# Patient Record
Sex: Male | Born: 1955 | Race: White | Hispanic: No | Marital: Married | Smoking: Never smoker
Health system: Southern US, Community
[De-identification: ages and names within clinical notes are randomized; demographics above are authoritative.]

## PROBLEM LIST (undated history)

## (undated) DIAGNOSIS — I5189 Other ill-defined heart diseases: Secondary | ICD-10-CM

## (undated) DIAGNOSIS — I2510 Atherosclerotic heart disease of native coronary artery without angina pectoris: Secondary | ICD-10-CM

## (undated) DIAGNOSIS — R918 Other nonspecific abnormal finding of lung field: Secondary | ICD-10-CM

## (undated) DIAGNOSIS — E785 Hyperlipidemia, unspecified: Secondary | ICD-10-CM

## (undated) HISTORY — DX: Hyperlipidemia, unspecified: E78.5

## (undated) HISTORY — DX: Other nonspecific abnormal finding of lung field: R91.8

---

## 2017-06-07 DIAGNOSIS — R918 Other nonspecific abnormal finding of lung field: Secondary | ICD-10-CM

## 2017-06-07 HISTORY — DX: Other nonspecific abnormal finding of lung field: R91.8

## 2017-07-07 ENCOUNTER — Inpatient Hospital Stay (HOSPITAL_COMMUNITY)
Admission: EM | Admit: 2017-07-07 | Discharge: 2017-07-09 | DRG: 247 | Disposition: A | Discharge status: Home / Self Care | Payer: Managed Care, Other (non HMO) | Attending: Cardiology | Admitting: Cardiology

## 2017-07-07 ENCOUNTER — Other Ambulatory Visit: Payer: Self-pay

## 2017-07-07 ENCOUNTER — Encounter (HOSPITAL_COMMUNITY): Payer: Self-pay

## 2017-07-07 ENCOUNTER — Emergency Department (HOSPITAL_COMMUNITY): Payer: Managed Care, Other (non HMO)

## 2017-07-07 DIAGNOSIS — E785 Hyperlipidemia, unspecified: Secondary | ICD-10-CM

## 2017-07-07 DIAGNOSIS — R079 Chest pain, unspecified: Secondary | ICD-10-CM | POA: Diagnosis not present

## 2017-07-07 DIAGNOSIS — Z8249 Family history of ischemic heart disease and other diseases of the circulatory system: Secondary | ICD-10-CM

## 2017-07-07 DIAGNOSIS — R9389 Abnormal findings on diagnostic imaging of other specified body structures: Secondary | ICD-10-CM

## 2017-07-07 DIAGNOSIS — I2510 Atherosclerotic heart disease of native coronary artery without angina pectoris: Secondary | ICD-10-CM | POA: Diagnosis present

## 2017-07-07 DIAGNOSIS — R911 Solitary pulmonary nodule: Secondary | ICD-10-CM | POA: Diagnosis present

## 2017-07-07 DIAGNOSIS — K219 Gastro-esophageal reflux disease without esophagitis: Secondary | ICD-10-CM | POA: Diagnosis present

## 2017-07-07 DIAGNOSIS — I10 Essential (primary) hypertension: Secondary | ICD-10-CM | POA: Diagnosis present

## 2017-07-07 DIAGNOSIS — I2109 ST elevation (STEMI) myocardial infarction involving other coronary artery of anterior wall: Secondary | ICD-10-CM | POA: Diagnosis not present

## 2017-07-07 DIAGNOSIS — Z955 Presence of coronary angioplasty implant and graft: Secondary | ICD-10-CM

## 2017-07-07 DIAGNOSIS — J439 Emphysema, unspecified: Secondary | ICD-10-CM | POA: Diagnosis present

## 2017-07-07 LAB — BASIC METABOLIC PANEL
Anion gap: 11 (ref 5–15)
BUN: 14 mg/dL (ref 6–20)
CALCIUM: 9.1 mg/dL (ref 8.9–10.3)
CO2: 19 mmol/L — ABNORMAL LOW (ref 22–32)
Chloride: 108 mmol/L (ref 101–111)
Creatinine, Ser: 1.05 mg/dL (ref 0.61–1.24)
GFR calc Af Amer: >60 mL/min (ref >60)
GLUCOSE: 100 mg/dL — AB (ref 65–99)
Potassium: 3.6 mmol/L (ref 3.5–5.1)
Sodium: 138 mmol/L (ref 135–145)

## 2017-07-07 LAB — CBC
HEMATOCRIT: 42.5 % (ref 39.0–52.0)
Hemoglobin: 15 g/dL (ref 13.0–17.0)
MCH: 31.3 pg (ref 26.0–34.0)
MCHC: 35.3 g/dL (ref 30.0–36.0)
MCV: 88.7 fL (ref 78.0–100.0)
Platelets: 334 10*3/uL (ref 150–400)
RBC: 4.79 MIL/uL (ref 4.22–5.81)
RDW: 12.3 % (ref 11.5–15.5)
WBC: 6.1 10*3/uL (ref 4.0–10.5)

## 2017-07-07 LAB — I-STAT TROPONIN, ED: TROPONIN I, POC: 0.02 ng/mL (ref 0.00–0.08)

## 2017-07-07 MED ORDER — ONDANSETRON HCL 4 MG PO TABS: 4.0000 mg | ORAL_TABLET | Freq: Four times a day (QID) | ORAL | Status: DC | PRN

## 2017-07-07 MED ORDER — ASPIRIN 81 MG PO CHEW: 324.0000 mg | CHEWABLE_TABLET | Freq: Once | ORAL | Status: DC

## 2017-07-07 MED ORDER — ONDANSETRON HCL 4 MG/2ML IJ SOLN: 4.0000 mg | Freq: Four times a day (QID) | INTRAMUSCULAR | Status: DC | PRN

## 2017-07-07 MED ORDER — ACETAMINOPHEN 325 MG PO TABS: 650.0000 mg | ORAL_TABLET | Freq: Four times a day (QID) | ORAL | Status: DC | PRN

## 2017-07-07 MED ORDER — NITROGLYCERIN 0.4 MG SL SUBL
0.4000 mg | SUBLINGUAL_TABLET | SUBLINGUAL | Status: DC | PRN
  Administered 2017-07-08: 0.8 mg via SUBLINGUAL
  Filled 2017-07-07: qty 1

## 2017-07-07 MED ORDER — ACETAMINOPHEN 650 MG RE SUPP: 650.0000 mg | Freq: Four times a day (QID) | RECTAL | Status: DC | PRN

## 2017-07-07 MED ORDER — ENOXAPARIN SODIUM 40 MG/0.4ML ~~LOC~~ SOLN
40.0000 mg | SUBCUTANEOUS | Status: DC
  Filled 2017-07-07: qty 0.4

## 2017-07-07 NOTE — ED Notes (Addendum)
Admitting MD Kakrakandy at bedside.  

## 2017-07-07 NOTE — ED Notes (Signed)
Pt states that "if I relax I can get the pain to go away for about 15 minutes."

## 2017-07-07 NOTE — Progress Notes (Signed)
Carryover admission: 62 year old male presented with chest pain that is exertional.  Onset a month ago and progressively worsening over the last 2 weeks.  On presentation to the ED, first set of troponin negative EKG with no signs of acute ischemic changes.  Family history of premature coronary artery disease.  Admitted to the telemetry unit as observation status.  No charge.

## 2017-07-07 NOTE — ED Provider Notes (Signed)
[facility] EMERGENCY DEPARTMENT Provider Note   CSN: [id number] Arrival date & time: 07/07/17  1445     History   Chief Complaint Chief Complaint  Patient presents with  . Chest Pain    HPI Elmor Kost is a 62 y.o. male.  HPI    Had gone to [facility] 4/21 for chest pain, that episode had exertional chest pain, woke up with it again the next.  Found spots on lungs, quarantine for 2 days for possible TB which was ruled out but did not have chest pain eval.  Since then has been having chest pain.  The pattern before starting about one month ago was pain with exertion, but now is having pain even at work, and even at rest.  Will come on at night when in bed.  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    Describes pain as burning and tightness followed by fatigue/exhaustion.  Has been there worsening with exertion over the last month but for last week has had it with minimal stress and even rest at times.  Today had associated diaphoresis.   No other medical problems, no smoking, drugs, occ etoh Mom diagnosed with "infantile" sized coronary arteries with CAD in 70s  No recent travel (drove from [place] in Jan) No recent surgeries, no asymmetric leg swelling    History reviewed. No pertinent past medical history.  There are no active problems to display for this patient.   History reviewed. No pertinent surgical history.      Home Medications    Prior to Admission medications   Not on File    Family History No family history on file.  Social History Social History   Tobacco Use  . Smoking status: Not on file  Substance Use Topics  . Alcohol use: Not on file  . Drug use: Not on file     Allergies   Patient has no known allergies.   Review of Systems Review of Systems  Constitutional: Positive for diaphoresis and fatigue. Negative for fever.  HENT:  Negative for sore throat.   Eyes: Negative for visual disturbance.  Respiratory: Positive for shortness of breath.   Cardiovascular: Positive for chest pain.  Gastrointestinal: Negative for abdominal pain, nausea and vomiting.  Genitourinary: Negative for difficulty urinating.  Musculoskeletal: Negative for back pain and neck stiffness.  Skin: Negative for rash.  Neurological: Positive for light-headedness. Negative for syncope and headaches.     Physical Exam Updated Vital Signs BP 133/90   Pulse 75   Resp 15   SpO2 99%   Physical Exam  Constitutional: He is oriented to person, place, and time. He appears well-developed and well-nourished. No distress.  HENT:  Head: Normocephalic and atraumatic.  Eyes: Conjunctivae and EOM are normal.  Neck: Normal range of motion.  Cardiovascular: Normal rate, regular rhythm, normal heart sounds and intact distal pulses. Exam reveals no gallop and no friction rub.  No murmur heard. Pulmonary/Chest: Effort normal and breath sounds normal. No respiratory distress. He has no wheezes. He has no rales.  Abdominal: Soft. He exhibits no distension. There is no tenderness. There is no guarding.  Musculoskeletal: He exhibits no edema.  Neurological: He is alert and oriented to person, place, and time.  Skin: Skin is warm and dry. He is not diaphoretic.  Nursing note and vitals reviewed.    ED Treatments / Results  Labs (all labs ordered are  listed, but only abnormal results are displayed) Labs Reviewed  BASIC METABOLIC PANEL - Abnormal; Notable for the following components:      Result Value   CO2 19 (*)    Glucose, Bld 100 (*)    All other components within normal limits  CBC  I-STAT TROPONIN, ED    EKG EKG Interpretation  Date/Time:  Wednesday Jul 07 2017 14:48:12 EDT Ventricular Rate:  81 PR Interval:    QRS Duration: 89 QT Interval:  376 QTC Calculation: 437 R Axis:   68 Text Interpretation:  Sinus rhythm RSR' in V1 or V2,  probably normal variant Nonspecific T abnrm, anterolateral leads No previous ECGs available Confirmed by Gareth Morgan [phone]) on 07/07/2017 3:05:09 PM   Radiology No results found.  Procedures Procedures (including critical care time)  Medications Ordered in ED Medications - No data to display   Initial Impression / Assessment and Plan / ED Course  I have reviewed the triage vital signs and the nursing notes.  Pertinent labs & imaging results that were available during my care of the patient were reviewed by me and considered in my medical decision making (see chart for details).     62 year old male with no significant medical history presents with concern for chest pain.  Patient reports that chest pain had been only present with exertion beginning a month ago, however that over the last week or 2 he has had increasing chest pain with minimal activity, stressful situations, and sometimes rest.  EKG was evaluate me and showed nonspecific changes.  Chest x-ray shows no signs of pneumonia, pneumothorax, or pulmonary edema.  He does not have risk factors for pulmonary embolus, and have low suspicion for PE.  He was recently admitted to [facility] after he had presented with chest pain, however chest x-ray at that time was concerning for nodules and was admitted for TB work up which was negative.    Given exertional symptoms, worsening symptoms now with rest, concern chest pain represents angina despite patient not having significant risk factors. Does have family hx of CAD.  Heart score on my evaluation is 4.  Will consult for admission. Given aspirin. CP free at this time.    Final Clinical Impressions(s) / ED Diagnoses   Final diagnoses:  Chest pain, unspecified type    ED Discharge Orders    None       Gareth Morgan, MD 07/07/17 [phone]

## 2017-07-07 NOTE — ED Triage Notes (Addendum)
Per [facility]: Was at work has been stressed and left work. Had acute onset of chest pain, across the top of his chest, non radiating. After 15 minutes the pain went away. When he got home was telling his wife about it, started hyperventilating, hands were spasming. When breathing slowed down symptoms went away. Has had cough for a year. Went to ED around Easter and found "spots on his lungs", cleared from cardiac at ED that day. Was trying to follow up with cardiologist.  Took 324 ASA at home.  Pt is pain free at this time.

## 2017-07-07 NOTE — ED Notes (Signed)
Dr. Schlossman at bedside. 

## 2017-07-07 NOTE — H&P (Signed)
History and Physical    Casey Cummings TDV:[id number] DOB: Jul 15, 1955 DOA: 07/07/2017  PCP: Patient, No Pcp Per  Patient coming from: Home.  Chief Complaint: Chest pain.  HPI: Casey Cummings is a 62 y.o. male with history of pulmonary no need recently diagnosed being followed by [facility] who had extensively travel and has been recently working with refugees presently complains of chest pain.  Patient has been having chest pain off and on for last 1 week.  Initially it was exertional substernal across the chest.  May try it became more prominent during nights and while taking shower.  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  Patient also started having chest pain after going home and decided to come to the ER and called EMS.  Chest pain resolved without any intervention.  ED Course: In the ER EKG shows normal sinus rhythm troponin was negative.  Chest x-ray was showing pulmonary nodule.  Presently chest pain-free.  Blood pressure is mildly elevated.  Patient's mother has history of congenital infantile coronaries and had required interventions.  Patient admitted for further management.  Review of Systems: As per HPI, rest all negative.   History reviewed. No pertinent past medical history.  History reviewed. No pertinent surgical history.   reports that he has never smoked. He has never used smokeless tobacco. He reports that he drinks alcohol. He reports that he does not use drugs.  No Known Allergies  Family History  Problem Relation Age of Onset  . CAD Mother     Prior to Admission medications   Medication Sig Start Date End Date Taking? Authorizing Provider  aspirin 325 MG EC tablet Take 325 mg by mouth every 6 (six) hours as needed for pain.   Yes [provider]  aspirin-acetaminophen-caffeine (EXCEDRIN MIGRAINE) [phone] MG tablet Take 1 tablet by mouth every 6 (six) hours as needed for headache.   Yes [provider]  Multiple Vitamin (MULTIVITAMIN) tablet Take 1 tablet by mouth daily.   Yes [provider]    Physical Exam: Vitals:   07/07/17 1945 07/07/17 2000 07/07/17 2015 07/07/17 2030  BP: 128/78 123/83 123/87 123/86  Pulse: 73 70 66 69  Resp: 12 14 18 14   SpO2: 99% 97% 100% 98%      Constitutional: Moderately built and nourished. Vitals:   07/07/17 1945 07/07/17 2000 07/07/17 2015 07/07/17 2030  BP: 128/78 123/83 123/87 123/86  Pulse: 73 70 66 69  Resp: 12 14 18 14   SpO2: 99% 97% 100% 98%   Eyes: Anicteric no pallor. ENMT: No discharge from the ears eyes nose or mouth. Neck: No mass felt.  No neck rigidity. Respiratory: No rhonchi or crepitations. Cardiovascular: S1-S2 heard no murmurs appreciated. Abdomen: Soft nontender bowel sounds present. Musculoskeletal: No edema.  No joint effusion. Skin: No rash.  Skin appears warm. Neurologic: Alert awake oriented to time place and person.  Moves all extremities 5 x 5. Psychiatric: Appears normal.  Normal affect.   Labs on Admission: I have personally reviewed following labs and imaging studies  CBC: Recent Labs  Lab 07/07/17 1454  WBC 6.1  HGB 15.0  HCT 42.5  MCV 88.7  PLT 462   Basic Metabolic Panel: Recent Labs  Lab 07/07/17 1454  NA 138  K 3.6  CL 108  CO2 19*  GLUCOSE 100*  BUN 14  CREATININE 1.05  CALCIUM 9.1   GFR: CrCl cannot be calculated (Unknown ideal weight.).  Liver Function Tests: No results for input(s): AST, ALT, ALKPHOS, BILITOT, PROT, ALBUMIN in the last 168 hours. No results for input(s): LIPASE, AMYLASE in the last 168 hours. No results for input(s): AMMONIA in the last 168 hours. Coagulation Profile: No results for input(s): INR, PROTIME in the last 168 hours. Cardiac Enzymes: No results for input(s): CKTOTAL, CKMB, CKMBINDEX, TROPONINI in the last 168 hours. BNP (last 3 results) No results for input(s): PROBNP in the last 8760 hours. HbA1C: No results for input(s):  HGBA1C in the last 72 hours. CBG: No results for input(s): GLUCAP in the last 168 hours. Lipid Profile: No results for input(s): CHOL, HDL, LDLCALC, TRIG, CHOLHDL, LDLDIRECT in the last 72 hours. Thyroid Function Tests: No results for input(s): TSH, T4TOTAL, FREET4, T3FREE, THYROIDAB in the last 72 hours. Anemia Panel: No results for input(s): VITAMINB12, FOLATE, FERRITIN, TIBC, IRON, RETICCTPCT in the last 72 hours. Urine analysis: No results found for: COLORURINE, APPEARANCEUR, LABSPEC, PHURINE, GLUCOSEU, HGBUR, BILIRUBINUR, KETONESUR, PROTEINUR, UROBILINOGEN, NITRITE, LEUKOCYTESUR Sepsis Labs: @LABRCNTIP (procalcitonin:4,lacticidven:4) )No results found for this or any previous visit (from the past 240 hour(s)).   Radiological Exams on Admission: Dg Chest 2 View  Result Date: 07/07/2017 CLINICAL DATA:  63 year old male with a history of chest pain EXAM: CHEST - 2 VIEW COMPARISON:  06/12/2017 FINDINGS: Cardiomediastinal silhouette unchanged in size and contour. No evidence of central vascular congestion. No pneumothorax or pleural effusion. No confluent airspace disease. Similar appearance of pleuroparenchymal thickening at the bilateral lung apices. Stigmata of emphysema, with increased retrosternal airspace, flattened hemidiaphragms, increased AP diameter, and hyperinflation on the AP view. No displaced fracture IMPRESSION: Chronic lung changes and emphysema, without evidence of acute cardiopulmonary disease. Redemonstration of apical pleuroparenchymal thickening. As was discussed on prior plain film, chest CT is recommended to rule out malignancy. Note that low-dose CT lung cancer screening is recommended for patients who are 84-66 years of age with a 30+ pack-year history of smoking, and who are currently smoking or quit <=15 years ago. Referral for pulmonary consultation and evaluation for potential candidacy may be useful. Electronically Signed   By: Corrie Mckusick D.O.   On: 07/07/2017 15:57      EKG: Independently reviewed.  Normal sinus rhythm.  Assessment/Plan Principal Problem:   Chest pain Active Problems:   Pulmonary nodule    1. Chest pain -we will cycle cardiac markers to rule out ACS.  Check 2D echo.  Check d-dimer.  Aspirin.  PRN nitroglycerin.  Cardiology notified. 2. Elevated blood pressure -closely follow blood pressure trends.  Presently did not add any medications. 3. Pulmonary nodule being followed at [facility].  Was recently admitted for ruling out TB.   DVT prophylaxis: Lovenox. Code Status: Full code. Family Communication: Patient's wife. Disposition Plan: Home. Consults called: Cardiology. Admission status: Observation current   Rise Patience MD [facility] Pager [phone].  If 7PM-7AM, please contact night-coverage [email or web address] Password [facility]  07/07/2017, 10:23 PM

## 2017-07-08 ENCOUNTER — Observation Stay (HOSPITAL_COMMUNITY): Payer: Managed Care, Other (non HMO)

## 2017-07-08 ENCOUNTER — Observation Stay (HOSPITAL_BASED_OUTPATIENT_CLINIC_OR_DEPARTMENT_OTHER): Payer: Managed Care, Other (non HMO)

## 2017-07-08 ENCOUNTER — Other Ambulatory Visit: Payer: Self-pay

## 2017-07-08 ENCOUNTER — Inpatient Hospital Stay (HOSPITAL_COMMUNITY)
Admission: EM | Disposition: A | Discharge status: Home / Self Care | Payer: Self-pay | Source: Home / Self Care | Attending: Cardiology

## 2017-07-08 DIAGNOSIS — R911 Solitary pulmonary nodule: Secondary | ICD-10-CM

## 2017-07-08 DIAGNOSIS — I200 Unstable angina: Secondary | ICD-10-CM

## 2017-07-08 DIAGNOSIS — I2510 Atherosclerotic heart disease of native coronary artery without angina pectoris: Secondary | ICD-10-CM

## 2017-07-08 DIAGNOSIS — R079 Chest pain, unspecified: Secondary | ICD-10-CM

## 2017-07-08 DIAGNOSIS — I2109 ST elevation (STEMI) myocardial infarction involving other coronary artery of anterior wall: Secondary | ICD-10-CM | POA: Diagnosis not present

## 2017-07-08 DIAGNOSIS — I2102 ST elevation (STEMI) myocardial infarction involving left anterior descending coronary artery: Secondary | ICD-10-CM | POA: Diagnosis not present

## 2017-07-08 DIAGNOSIS — R072 Precordial pain: Secondary | ICD-10-CM | POA: Diagnosis not present

## 2017-07-08 DIAGNOSIS — R9389 Abnormal findings on diagnostic imaging of other specified body structures: Secondary | ICD-10-CM

## 2017-07-08 HISTORY — PX: LEFT HEART CATH AND CORONARY ANGIOGRAPHY: CATH118249

## 2017-07-08 HISTORY — PX: CORONARY/GRAFT ACUTE MI REVASCULARIZATION: CATH118305

## 2017-07-08 LAB — LIPID PANEL
CHOLESTEROL: 175 mg/dL (ref 0–200)
HDL: 48 mg/dL (ref >40)
LDL CALC: 102 mg/dL — AB (ref 0–99)
TRIGLYCERIDES: 126 mg/dL (ref <150)
Total CHOL/HDL Ratio: 3.6 RATIO
VLDL: 25 mg/dL (ref 0–40)

## 2017-07-08 LAB — BASIC METABOLIC PANEL
ANION GAP: 6 (ref 5–15)
BUN: 12 mg/dL (ref 6–20)
CALCIUM: 8.8 mg/dL — AB (ref 8.9–10.3)
CO2: 26 mmol/L (ref 22–32)
CREATININE: 1.07 mg/dL (ref 0.61–1.24)
Chloride: 108 mmol/L (ref 101–111)
Glucose, Bld: 94 mg/dL (ref 65–99)
Potassium: 3.9 mmol/L (ref 3.5–5.1)
Sodium: 140 mmol/L (ref 135–145)

## 2017-07-08 LAB — CBC
HCT: 42 % (ref 39.0–52.0)
HEMATOCRIT: 41.7 % (ref 39.0–52.0)
HEMOGLOBIN: 14.1 g/dL (ref 13.0–17.0)
Hemoglobin: 14.3 g/dL (ref 13.0–17.0)
MCH: 31.2 pg (ref 26.0–34.0)
MCH: 30.7 pg (ref 26.0–34.0)
MCHC: 34.3 g/dL (ref 30.0–36.0)
MCHC: 33.6 g/dL (ref 30.0–36.0)
MCV: 90.8 fL (ref 78.0–100.0)
MCV: 91.5 fL (ref 78.0–100.0)
PLATELETS: 292 10*3/uL (ref 150–400)
PLATELETS: 306 10*3/uL (ref 150–400)
RBC: 4.59 MIL/uL (ref 4.22–5.81)
RBC: 4.59 MIL/uL (ref 4.22–5.81)
RDW: 12.8 % (ref 11.5–15.5)
RDW: 13 % (ref 11.5–15.5)
WBC: 7.6 10*3/uL (ref 4.0–10.5)
WBC: 6.5 10*3/uL (ref 4.0–10.5)

## 2017-07-08 LAB — TSH: TSH: 3.323 u[IU]/mL (ref 0.350–4.500)

## 2017-07-08 LAB — CREATININE, SERUM: Creatinine, Ser: 0.87 mg/dL (ref 0.61–1.24)

## 2017-07-08 LAB — ECHOCARDIOGRAM COMPLETE
HEIGHTINCHES: 70 in
Weight: 2569.6 oz

## 2017-07-08 LAB — MRSA PCR SCREENING: MRSA BY PCR: NEGATIVE

## 2017-07-08 LAB — D-DIMER, QUANTITATIVE: D-Dimer, Quant: <0.27 ug/mL-FEU (ref 0.00–0.50)

## 2017-07-08 LAB — HEMOGLOBIN A1C
Hgb A1c MFr Bld: 5.6 % (ref 4.8–5.6)
Mean Plasma Glucose: 114.02 mg/dL

## 2017-07-08 LAB — HIV ANTIBODY (ROUTINE TESTING W REFLEX): HIV SCREEN 4TH GENERATION: NONREACTIVE

## 2017-07-08 LAB — TROPONIN I
TROPONIN I: 0.03 ng/mL — AB (ref <0.03)
TROPONIN I: 0.04 ng/mL — AB (ref <0.03)
Troponin I: 0.03 ng/mL (ref <0.03)
Troponin I: <0.03 ng/mL (ref <0.03)
Troponin I: <0.03 ng/mL (ref <0.03)

## 2017-07-08 LAB — MAGNESIUM: MAGNESIUM: 2 mg/dL (ref 1.7–2.4)

## 2017-07-08 LAB — PROTIME-INR
INR: 1.06
Prothrombin Time: 13.7 seconds (ref 11.4–15.2)

## 2017-07-08 LAB — POCT ACTIVATED CLOTTING TIME: ACTIVATED CLOTTING TIME: 307 s

## 2017-07-08 SURGERY — LEFT HEART CATH AND CORONARY ANGIOGRAPHY
Anesthesia: LOCAL

## 2017-07-08 MED ORDER — NITROGLYCERIN 1 MG/10 ML FOR IR/CATH LAB
INTRA_ARTERIAL | Status: DC | PRN
  Administered 2017-07-08: 200 ug via INTRACORONARY

## 2017-07-08 MED ORDER — MIDAZOLAM HCL 2 MG/2ML IJ SOLN
INTRAMUSCULAR | Status: AC
  Filled 2017-07-08: qty 2

## 2017-07-08 MED ORDER — SODIUM CHLORIDE 0.9 % IV SOLN: INTRAVENOUS | Status: AC

## 2017-07-08 MED ORDER — ASPIRIN 81 MG PO CHEW
CHEWABLE_TABLET | ORAL | Status: AC
  Filled 2017-07-08: qty 4

## 2017-07-08 MED ORDER — ASPIRIN 81 MG PO CHEW
81.0000 mg | CHEWABLE_TABLET | Freq: Every day | ORAL | Status: DC
  Administered 2017-07-09: 81 mg via ORAL
  Filled 2017-07-08: qty 1

## 2017-07-08 MED ORDER — HYDRALAZINE HCL 20 MG/ML IJ SOLN: 5.0000 mg | INTRAMUSCULAR | Status: AC | PRN

## 2017-07-08 MED ORDER — ATORVASTATIN CALCIUM 80 MG PO TABS
80.0000 mg | ORAL_TABLET | Freq: Every day | ORAL | Status: DC
  Administered 2017-07-08: 80 mg via ORAL
  Filled 2017-07-08: qty 1

## 2017-07-08 MED ORDER — HEPARIN BOLUS VIA INFUSION
4000.0000 [IU] | Freq: Once | INTRAVENOUS | Status: DC
Start: 2017-07-08 — End: 2017-07-08
  Filled 2017-07-08: qty 4000

## 2017-07-08 MED ORDER — FENTANYL CITRATE (PF) 100 MCG/2ML IJ SOLN
INTRAMUSCULAR | Status: AC
  Filled 2017-07-08: qty 2

## 2017-07-08 MED ORDER — TICAGRELOR 90 MG PO TABS
ORAL_TABLET | ORAL | Status: DC | PRN
  Administered 2017-07-08: 180 mg via ORAL

## 2017-07-08 MED ORDER — LIDOCAINE HCL (PF) 1 % IJ SOLN
INTRAMUSCULAR | Status: DC | PRN
  Administered 2017-07-08: 2 mL via SUBCUTANEOUS

## 2017-07-08 MED ORDER — SODIUM CHLORIDE 0.9% FLUSH: 3.0000 mL | INTRAVENOUS | Status: DC | PRN

## 2017-07-08 MED ORDER — HEPARIN (PORCINE) IN NACL 2-0.9 UNITS/ML
INTRAMUSCULAR | Status: AC | PRN
  Administered 2017-07-08 (×2): 500 mL

## 2017-07-08 MED ORDER — IOPAMIDOL (ISOVUE-370) INJECTION 76%
100.0000 mL | Freq: Once | INTRAVENOUS | Status: AC | PRN
  Administered 2017-07-08: 80 mL via INTRAVENOUS

## 2017-07-08 MED ORDER — ASPIRIN 81 MG PO CHEW
CHEWABLE_TABLET | ORAL | Status: DC | PRN
  Administered 2017-07-08: 324 mg via ORAL

## 2017-07-08 MED ORDER — SODIUM CHLORIDE 0.9% FLUSH: 3.0000 mL | Freq: Two times a day (BID) | INTRAVENOUS | Status: DC

## 2017-07-08 MED ORDER — HEPARIN SODIUM (PORCINE) 1000 UNIT/ML IJ SOLN
INTRAMUSCULAR | Status: DC | PRN
  Administered 2017-07-08: 3500 [IU] via INTRAVENOUS
  Administered 2017-07-08: 4000 [IU] via INTRAVENOUS

## 2017-07-08 MED ORDER — ASPIRIN 81 MG PO CHEW: 81.0000 mg | CHEWABLE_TABLET | ORAL | Status: DC

## 2017-07-08 MED ORDER — SODIUM CHLORIDE 0.9 % IV SOLN: 250.0000 mL | INTRAVENOUS | Status: DC | PRN

## 2017-07-08 MED ORDER — METOPROLOL TARTRATE 50 MG PO TABS: 50.0000 mg | ORAL_TABLET | Freq: Once | ORAL | Status: DC

## 2017-07-08 MED ORDER — LABETALOL HCL 5 MG/ML IV SOLN: 10.0000 mg | INTRAVENOUS | Status: AC | PRN

## 2017-07-08 MED ORDER — TICAGRELOR 90 MG PO TABS
ORAL_TABLET | ORAL | Status: AC
  Filled 2017-07-08: qty 2

## 2017-07-08 MED ORDER — ATORVASTATIN CALCIUM 40 MG PO TABS: 40.0000 mg | ORAL_TABLET | Freq: Every day | ORAL | Status: DC

## 2017-07-08 MED ORDER — NITROGLYCERIN 0.4 MG SL SUBL
SUBLINGUAL_TABLET | SUBLINGUAL | Status: AC
  Administered 2017-07-08: 0.8 mg via SUBLINGUAL
  Filled 2017-07-08: qty 2

## 2017-07-08 MED ORDER — FENTANYL CITRATE (PF) 100 MCG/2ML IJ SOLN
INTRAMUSCULAR | Status: DC | PRN
  Administered 2017-07-08 (×2): 50 ug via INTRAVENOUS

## 2017-07-08 MED ORDER — SODIUM CHLORIDE 0.9% FLUSH
3.0000 mL | Freq: Two times a day (BID) | INTRAVENOUS | Status: DC
  Administered 2017-07-09: 3 mL via INTRAVENOUS

## 2017-07-08 MED ORDER — HEPARIN (PORCINE) IN NACL 1000-0.9 UT/500ML-% IV SOLN
INTRAVENOUS | Status: AC
  Filled 2017-07-08: qty 1000

## 2017-07-08 MED ORDER — SODIUM CHLORIDE 0.9 % WEIGHT BASED INFUSION: 1.0000 mL/kg/h | INTRAVENOUS | Status: DC

## 2017-07-08 MED ORDER — ONDANSETRON HCL 4 MG/2ML IJ SOLN: 4.0000 mg | Freq: Four times a day (QID) | INTRAMUSCULAR | Status: DC | PRN

## 2017-07-08 MED ORDER — IOPAMIDOL (ISOVUE-370) INJECTION 76%
INTRAVENOUS | Status: AC
  Filled 2017-07-08: qty 100

## 2017-07-08 MED ORDER — SODIUM CHLORIDE 0.9 % WEIGHT BASED INFUSION: 3.0000 mL/kg/h | INTRAVENOUS | Status: DC

## 2017-07-08 MED ORDER — HEPARIN SODIUM (PORCINE) 1000 UNIT/ML IJ SOLN
INTRAMUSCULAR | Status: AC
  Filled 2017-07-08: qty 1

## 2017-07-08 MED ORDER — VERAPAMIL HCL 2.5 MG/ML IV SOLN
INTRAVENOUS | Status: AC
  Filled 2017-07-08: qty 2

## 2017-07-08 MED ORDER — MIDAZOLAM HCL 2 MG/2ML IJ SOLN
INTRAMUSCULAR | Status: DC | PRN
  Administered 2017-07-08: 2 mg via INTRAVENOUS

## 2017-07-08 MED ORDER — VERAPAMIL HCL 2.5 MG/ML IV SOLN
INTRAVENOUS | Status: DC | PRN
  Administered 2017-07-08: 10 mL via INTRA_ARTERIAL

## 2017-07-08 MED ORDER — IOPAMIDOL (ISOVUE-370) INJECTION 76%
INTRAVENOUS | Status: DC | PRN
  Administered 2017-07-08: 115 mL via INTRA_ARTERIAL

## 2017-07-08 MED ORDER — NITROGLYCERIN 1 MG/10 ML FOR IR/CATH LAB
INTRA_ARTERIAL | Status: AC
  Filled 2017-07-08: qty 10

## 2017-07-08 MED ORDER — TICAGRELOR 90 MG PO TABS
90.0000 mg | ORAL_TABLET | Freq: Two times a day (BID) | ORAL | Status: DC
  Administered 2017-07-09: 90 mg via ORAL
  Filled 2017-07-08: qty 1

## 2017-07-08 MED ORDER — IOHEXOL 350 MG/ML SOLN
INTRAVENOUS | Status: DC | PRN
  Administered 2017-07-08: 40 mL via INTRA_ARTERIAL

## 2017-07-08 MED ORDER — IOPAMIDOL (ISOVUE-370) INJECTION 76%
INTRAVENOUS | Status: AC
  Filled 2017-07-08: qty 125

## 2017-07-08 MED ORDER — ACETAMINOPHEN 325 MG PO TABS: 650.0000 mg | ORAL_TABLET | ORAL | Status: DC | PRN

## 2017-07-08 MED ORDER — SODIUM CHLORIDE 0.9 % IV SOLN
INTRAVENOUS | Status: AC | PRN
  Administered 2017-07-08: 250 mL
  Administered 2017-07-08: 20 mL/h via INTRAVENOUS

## 2017-07-08 MED ORDER — HEPARIN (PORCINE) IN NACL 100-0.45 UNIT/ML-% IJ SOLN
850.0000 [IU]/h | INTRAMUSCULAR | Status: DC
  Filled 2017-07-08: qty 250

## 2017-07-08 MED ORDER — LIDOCAINE HCL (PF) 1 % IJ SOLN
INTRAMUSCULAR | Status: AC
  Filled 2017-07-08: qty 30

## 2017-07-08 SURGICAL SUPPLY — 16 items
BALLN SAPPHIRE 2.5X12 (BALLOONS) ×2
BALLN SAPPHIRE ~~LOC~~ 4.0X12 (BALLOONS) ×2 IMPLANT
BALLOON SAPPHIRE 2.5X12 (BALLOONS) ×1 IMPLANT
CATH INFINITI JR4 5F (CATHETERS) ×2 IMPLANT
CATH VISTA GUIDE 6FR XBLAD3.5 (CATHETERS) ×2 IMPLANT
DEVICE RAD COMP TR BAND LRG (VASCULAR PRODUCTS) ×2 IMPLANT
GLIDESHEATH SLEND A-KIT 6F 22G (SHEATH) ×2 IMPLANT
GUIDEWIRE INQWIRE 1.5J.035X260 (WIRE) ×1 IMPLANT
INQWIRE 1.5J .035X260CM (WIRE) ×2
KIT ENCORE 26 ADVANTAGE (KITS) ×2 IMPLANT
KIT HEART LEFT (KITS) ×2 IMPLANT
PACK CARDIAC CATHETERIZATION (CUSTOM PROCEDURE TRAY) ×2 IMPLANT
STENT SYNERGY DES 3.5X16 (Permanent Stent) ×2 IMPLANT
TRANSDUCER W/STOPCOCK (MISCELLANEOUS) ×2 IMPLANT
TUBING CIL FLEX 10 FLL-RA (TUBING) ×2 IMPLANT
WIRE HI TORQ VERSACORE-J 145CM (WIRE) ×2 IMPLANT

## 2017-07-08 NOTE — H&P (View-Only) (Signed)
Cardiology Consultation:   Patient ID: Casey Cummings; [id number]; 12-16-1955   Admit date: 07/07/2017 Date of Consult: 07/08/2017  Primary Care Provider: Patient, No Pcp Per Primary Cardiologist: New to [place]  Primary Electrophysiologist:  None   Patient Profile:   Casey Cummings is a 62 y.o. male with recent hospitalization at [facility] where he presented with complaints of chest pain and was found to have pulmonary nodules concerning for TB (ruled out) and was discharged without a cardiac work-up who is being seen today for the evaluation of chest pain at the request of Dr. Hal Hope.  History of Present Illness:   Casey Cummings was in his usual state of health until early April when he began experiencing substernal chest tightness with intercourse. He noted some DOE at the time, but denied N/V, lightheadedness, dizziness, diaphoresis, or syncope. He stated his symptoms lasted for a few minutes and resolved spontaneously with rest and deep breathing. He continued to have intermittent episodes of chest tightness first thing in the morning when getting in the shower, as well as episodes at work when having stressful conversations. Yesterday he had an episode of chest tightness and DOE prompting him to leave work early, where he states he had to rest walking to the car, and by the time he got home, he was too weak to bring his work bag inside. He attempted deep breathing exercises, however began to feel lightheaded with numbness in his hands and feet, prompting him to call EMS. EMS suggested his lightheadedness and numbness in extremities were a result of hyperventilation.   He denies prior cardiac history or pertinent PMH. He is fairly active and prior to April has never experienced these symptoms before. He was recently admitted to [facility] from 4/21-23/19 with complaints of CP and DOE where he was found to have apical airspace disease with nodules in the RUL c/f possible TB. Incidentally, he was  noted to have a subtle myocardial fat deposition involving the LV apex which may relate to remote ischemia/infarct.  His PPD and AFBs were negative and he was referred to pulmonary outpatient for further work up. His CP was not addressed during this admission.   He is currently CP free. He denies SOB at rest. He has been sleeping on 3 pillows in an attempt to control his GERD which he states is improved. He denies PND, orthopnea, LE edema, syncope, or dizziness. He drinks a few glasses of wine per week. Denies tobacco or illicit drug use. Family history of CAD in his mother who had her first MI in her 27s and CABG in her 68s.  Hospital course:  VSS. Labs notable for electrolytes wnl, Cr 1.07, Hgb 14.1, PLT 306, TSH wnl, Ddimer wnl, Trop 0.03>0.03><0.03. CXR with with chronic lung changes and emphysema without acute findings; also with continue apical pleuroparenchymal thickening previously recommended for f/u CT chest in 4-6 weeks. EKG with NSR, no STE/D. Cardiology consulted for further recommendations.  History reviewed. No pertinent past medical history.  History reviewed. No pertinent surgical history.   Home Medications:  Prior to Admission medications   Medication Sig Start Date End Date Taking? Authorizing Provider  aspirin 325 MG EC tablet Take 325 mg by mouth every 6 (six) hours as needed for pain.   Yes [provider]  aspirin-acetaminophen-caffeine (EXCEDRIN MIGRAINE) [phone] MG tablet Take 1 tablet by mouth every 6 (six) hours as needed for headache.   Yes [provider]  Multiple Vitamin (MULTIVITAMIN) tablet Take 1 tablet by  mouth daily.   Yes [provider]    Inpatient Medications: Scheduled Meds: . enoxaparin (LOVENOX) injection  40 mg Subcutaneous Q24H  . metoprolol tartrate  50 mg Oral Once   Continuous Infusions:  PRN Meds: acetaminophen **OR** acetaminophen, nitroGLYCERIN, ondansetron **OR** ondansetron (ZOFRAN) IV  Allergies:   No  Known Allergies  Social History:   Social History   Socioeconomic History  . Marital status: Married    Spouse name: Not on file  . Number of children: Not on file  . Years of education: Not on file  . Highest education level: Not on file  Occupational History  . Not on file  Social Needs  . Financial resource strain: Not on file  . Food insecurity:    Worry: Not on file    Inability: Not on file  . Transportation needs:    Medical: Not on file    Non-medical: Not on file  Tobacco Use  . Smoking status: Never Smoker  . Smokeless tobacco: Never Used  Substance and Sexual Activity  . Alcohol use: Yes    Comment: Occasionally.  . Drug use: Never  . Sexual activity: Not on file  Lifestyle  . Physical activity:    Days per week: Not on file    Minutes per session: Not on file  . Stress: Not on file  Relationships  . Social connections:    Talks on phone: Not on file    Gets together: Not on file    Attends religious service: Not on file    Active member of club or organization: Not on file    Attends meetings of clubs or organizations: Not on file    Relationship status: Not on file  . Intimate partner violence:    Fear of current or ex partner: Not on file    Emotionally abused: Not on file    Physically abused: Not on file    Forced sexual activity: Not on file  Other Topics Concern  . Not on file  Social History Narrative  . Not on file    Family History:    Family History  Problem Relation Age of Onset  . CAD Mother      ROS:  Please see the history of present illness.   All other ROS reviewed and negative.     Physical Exam/Data:   Vitals:   07/08/17 0830 07/08/17 0845 07/08/17 0943 07/08/17 0955  BP: 116/73 112/71  (!) 123/91  Pulse: 64 (!) 59  72  Resp: 15 (!) 23  20  Temp:    98.4 F (36.9 C)  TempSrc:    Oral  SpO2: 97% 98%  97%  Weight:   160 lb 9.6 oz (72.8 kg)   Height:   5\' 10"  (1.778 m)    No intake or output data in the 24 hours  ending 07/08/17 1231 Filed Weights   07/08/17 0943  Weight: 160 lb 9.6 oz (72.8 kg)   Body mass index is 23.04 kg/m.  General:  Well nourished, well developed, in no acute distress HEENT: sclera anicteric, MMM Lymph: no adenopathy Neck: no JVD Endocrine:  No thryomegaly Vascular: No carotid bruits; distal pulses 2+ bilaterally Cardiac:  normal S1, S2; RRR; no murmur, gallops, or rubs Lungs:  clear to auscultation bilaterally, no wheezing, rhonchi or rales  Abd: NABS, soft, nontender, no hepatomegaly Ext: no edema Musculoskeletal:  No deformities, BUE and BLE strength normal and equal Skin: warm and dry  Neuro:  CNs  2-12 intact, no focal abnormalities noted Psych:  Normal affect   EKG:  The EKG was personally reviewed and demonstrates:  NSR no STE/D Telemetry:  Telemetry was personally reviewed and demonstrates:  NSR  Relevant CV Studies: None  Laboratory Data:  Chemistry Recent Labs  Lab 07/07/17 1454 07/08/17 0218 07/08/17 0500  NA 138  --  140  K 3.6  --  3.9  CL 108  --  108  CO2 19*  --  26  GLUCOSE 100*  --  94  BUN 14  --  12  CREATININE 1.05 0.87 1.07  CALCIUM 9.1  --  8.8*  GFRNONAA >60 >60 >60  GFRAA >60 >60 >60  ANIONGAP 11  --  6    No results for input(s): PROT, ALBUMIN, AST, ALT, ALKPHOS, BILITOT in the last 168 hours. Hematology Recent Labs  Lab 07/07/17 1454 07/08/17 0218 07/08/17 0500  WBC 6.1 7.6 6.5  RBC 4.79 4.59 4.59  HGB 15.0 14.3 14.1  HCT 42.5 41.7 42.0  MCV 88.7 90.8 91.5  MCH 31.3 31.2 30.7  MCHC 35.3 34.3 33.6  RDW 12.3 12.8 13.0  PLT 334 292 306   Cardiac Enzymes Recent Labs  Lab 07/08/17 0218 07/08/17 0500 07/08/17 1042  TROPONINI 0.03* 0.03* <0.03    Recent Labs  Lab 07/07/17 1548  TROPIPOC 0.02    BNPNo results for input(s): BNP, PROBNP in the last 168 hours.  DDimer  Recent Labs  Lab 07/08/17 0218  DDIMER <0.27    Radiology/Studies:  Dg Chest 2 View  Result Date: 07/07/2017 CLINICAL DATA:   62 year old male with a history of chest pain EXAM: CHEST - 2 VIEW COMPARISON:  06/12/2017 FINDINGS: Cardiomediastinal silhouette unchanged in size and contour. No evidence of central vascular congestion. No pneumothorax or pleural effusion. No confluent airspace disease. Similar appearance of pleuroparenchymal thickening at the bilateral lung apices. Stigmata of emphysema, with increased retrosternal airspace, flattened hemidiaphragms, increased AP diameter, and hyperinflation on the AP view. No displaced fracture IMPRESSION: Chronic lung changes and emphysema, without evidence of acute cardiopulmonary disease. Redemonstration of apical pleuroparenchymal thickening. As was discussed on prior plain film, chest CT is recommended to rule out malignancy. Note that low-dose CT lung cancer screening is recommended for patients who are 66-40 years of age with a 30+ pack-year history of smoking, and who are currently smoking or quit <=15 years ago. Referral for pulmonary consultation and evaluation for potential candidacy may be useful. Electronically Signed   By: Corrie Mckusick D.O.   On: 07/07/2017 15:57    Assessment and Plan:   1. Chest pain: p/w intermittent exertional CP and DOE for the past 3 weeks. Troponin trend flat 0.03>0.03. EKG without ischemic changes. His last ischemic testing was a POET which was negative ~5 years ago.  - Will plan for cardiac CTA to further evaluate CP given no significant PMH and only family history of CAD in his mother, onset in her 66s.  - Echo pending to evaluate LV function and for wall motion abnormalities - Can check lipid panel and Hgb A1C for risk stratification   2. Pulmonary nodules: recently hospitalized for TB r/o. Recommended for repeat CT chest in 4-6 weeks. To follow-up outpatient with Pulmonary 07/22/17   For questions or updates, please contact [place] Please consult [email or web address] for contact info under Cardiology/STEMI.   Signed, Abigail Butts,  PA-C  07/08/2017 12:31 PM [phone]   History and all data above reviewed.  Patient examined.  I agree  with the findings as above.  The patient reports chest pain that has been happening with exertion but increasingly severe and with less exertion over time.  It culminated yesterday in pain while at rest while talking to somebody at work.  It was dull substernal.  No radiation or associated symptoms.  He has not had this before.  He does not have significant risk factors but his mother had "very small arteries" and it was suggested that this could be familial.  He had a negative stress test several years ago.  The patient exam reveals COR:RRR  ,  Lungs: clear  ,  Abd: Positive bowel sounds, no rebound no guarding, Ext No edema  .  All available labs, radiology testing, previous records reviewed. Agree with documented assessment and plan. Chest pain:  Moderate pre test probability of CAD.  Plan is for CTA.  Check lipid profile.  Further management based on these results.    Jeneen Rinks Haruna Rohlfs  1:27 PM  07/08/2017

## 2017-07-08 NOTE — Progress Notes (Signed)
Pt c/o chest pain 4/10 during ECHO.  States it lasted 2-3 minutes and went away.  Did not require nitro SL. Dr. Erlinda Hong made aware.  Idolina Primer, RN

## 2017-07-08 NOTE — Progress Notes (Signed)
    Cardiac CTA was concerning for moderate to severe non-calcified plaque in the proximal LAD with associated stenosis that appears >70%. He is recommended for cardiac catheterization for further investigation. Dr. Percival Spanish discussed risks and patient is in agreement to undergo LHC tomorrow. Cath orders are in. NPO after MN tonight. Patient is 2nd case with Dr. Saunders Revel on 07/09/17.

## 2017-07-08 NOTE — Progress Notes (Signed)
  Echocardiogram 2D Echocardiogram has been performed.  Darlina Sicilian M 07/08/2017, 4:46 PM

## 2017-07-08 NOTE — Progress Notes (Signed)
ANTICOAGULATION CONSULT NOTE - Initial Consult  Pharmacy Consult for heparin Indication: chest pain/ACS  No Known Allergies  Patient Measurements: Height: 5\' 10"  (177.8 cm) Weight: 160 lb 9.6 oz (72.8 kg) IBW/kg (Calculated) : 73 Heparin Dosing Weight: 72.8 kg  Vital Signs: Temp: 98.4 F (36.9 C) (05/02 0955) Temp Source: Oral (05/02 0955) BP: 123/91 (05/02 0955) Pulse Rate: 72 (05/02 0955)  Labs: Recent Labs    07/07/17 1454 07/08/17 0218 07/08/17 0500 07/08/17 1042  HGB 15.0 14.3 14.1  --   HCT 42.5 41.7 42.0  --   PLT 334 292 306  --   CREATININE 1.05 0.87 1.07  --   TROPONINI  --  0.03* 0.03* <0.03    Estimated Creatinine Clearance: 74.7 mL/min (by C-G formula based on SCr of 1.07 mg/dL).   Medical History: History reviewed. No pertinent past medical history.  Medications:  Medications Prior to Admission  Medication Sig Dispense Refill Last Dose  . aspirin 325 MG EC tablet Take 325 mg by mouth every 6 (six) hours as needed for pain.   07/07/2017 at Unknown time  . aspirin-acetaminophen-caffeine (EXCEDRIN MIGRAINE) 250-250-65 MG tablet Take 1 tablet by mouth every 6 (six) hours as needed for headache.   prn at prn  . Multiple Vitamin (MULTIVITAMIN) tablet Take 1 tablet by mouth daily.   07/06/2017 at Unknown time    Assessment: Casey Cummings is a 47yoM, Cardiac CTA was concerning for moderate to severe non-calcified plaque in the proximal LAD with associated stenosis that appears >70%. Patient having chest pain, negative troponins. Pharmacy consulted to start heparin. Pt not on anticoagulants prior to this. Lovenox was ordered but refused last night. Plan is for LHC in the morning.  Goal of Therapy:  Heparin level 0.3-0.7 units/ml Monitor platelets by anticoagulation protocol: Yes   Plan: Give 4000 units bolus x 1 Start heparin infusion at 850 units/hr Check anti-Xa level in 6 hours and daily while on heparin Continue to monitor H&H and platelets   Thank  you for allowing [place] to participate in this patients care.  Jens Som, PharmD Clinical phone for 07/08/2017 from 3:30-10:30p: [phone] If after 10:30p, please call main pharmacy at: [phone] 07/08/2017 4:58 PM

## 2017-07-08 NOTE — Progress Notes (Signed)
PROGRESS NOTE  Casey Cummings ERD:[id number] DOB: November 12, 1955 DOA: 07/07/2017 PCP: Patient, No Pcp Per  HPI/Recap of past 24 hours:  Still has intermittent chest pain,   Assessment/Plan: Principal Problem:   Chest pain Active Problems:   Pulmonary nodule  Intermittent chest pain:  -troponin negative, ekg no acute changes,  -cardiology consulted, cardiac cta ordered "There is moderate to severe non-calcified plaque in the proximal LAD with associated stenosis that appears > 70%. A cardiac catheterization is recommended." -he continue to have intermittent chest pain, I have discussed the case with cardiology who recommended start on heparin drip -npo after midnight, cardiac cath in am -cardiology will take over the care.  HTN; stable on betablocker  Pulmonary nodules: recently hospitalized at [facility] , he is ruled out for TB. To follow-up outpatient with Pulmonary 07/22/17,  repeat CT chest in 4-6 weeks.      Code Status: full  Family Communication: patient and wife  Disposition Plan: not ready to discharge   Consultants:  cardiology  Procedures:  none  Antibiotics:  none   Objective: BP (!) 123/91 (BP Location: Right Arm)   Pulse 72   Temp 98.4 F (36.9 C) (Oral)   Resp 20   Ht 5\' 10"  (1.778 m)   Wt 72.8 kg (160 lb 9.6 oz)   SpO2 97%   BMI 23.04 kg/m  No intake or output data in the 24 hours ending 07/08/17 1646 Filed Weights   07/08/17 0943  Weight: 72.8 kg (160 lb 9.6 oz)    Exam: Patient is examined daily including today on 07/08/2017, exams remain the same as of yesterday except that has changed    General:  NAD  Cardiovascular: RRR  Respiratory: CTABL  Abdomen: Soft/ND/NT, positive BS  Musculoskeletal: No Edema  Neuro: alert, oriented   Data Reviewed: Basic Metabolic Panel: Recent Labs  Lab 07/07/17 1454 07/08/17 0218 07/08/17 0500  NA 138  --  140  K 3.6  --  3.9  CL 108  --  108  CO2 19*  --  26  GLUCOSE 100*   --  94  BUN 14  --  12  CREATININE 1.05 0.87 1.07  CALCIUM 9.1  --  8.8*  MG  --  2.0  --    Liver Function Tests: No results for input(s): AST, ALT, ALKPHOS, BILITOT, PROT, ALBUMIN in the last 168 hours. No results for input(s): LIPASE, AMYLASE in the last 168 hours. No results for input(s): AMMONIA in the last 168 hours. CBC: Recent Labs  Lab 07/07/17 1454 07/08/17 0218 07/08/17 0500  WBC 6.1 7.6 6.5  HGB 15.0 14.3 14.1  HCT 42.5 41.7 42.0  MCV 88.7 90.8 91.5  PLT 334 292 306   Cardiac Enzymes:   Recent Labs  Lab 07/08/17 0218 07/08/17 0500 07/08/17 1042  TROPONINI 0.03* 0.03* <0.03   BNP (last 3 results) No results for input(s): BNP in the last 8760 hours.  ProBNP (last 3 results) No results for input(s): PROBNP in the last 8760 hours.  CBG: No results for input(s): GLUCAP in the last 168 hours.  Recent Results (from the past 240 hour(s))  MRSA PCR Screening     Status: None   Collection Time: 07/08/17  1:13 PM  Result Value Ref Range Status   MRSA by PCR NEGATIVE NEGATIVE Final    Comment:        The GeneXpert MRSA Assay (FDA approved for NASAL specimens only), is one component of a comprehensive MRSA colonization  surveillance program. It is not intended to diagnose MRSA infection nor to guide or monitor treatment for MRSA infections. Performed at [facility], [place] [address] [place], [place] [address]      Studies: Ct Coronary Morph W/cta Cor W/score W/ca W/cm &/or Wo/cm  Addendum Date: 07/08/2017   ADDENDUM REPORT: 07/08/2017 14:27 CLINICAL DATA:  62 year old male with exertional chest pain and family h/o premature CAD. EXAM: Cardiac/Coronary  CT TECHNIQUE: The patient was scanned on a [employer]. FINDINGS: A 120 kV prospective scan was triggered in the descending thoracic aorta at 111 HU's. Axial non-contrast 3 mm slices were carried out through the heart. The data set was analyzed on a dedicated work station and scored using the  [place]. Gantry rotation speed was 250 msecs and collimation was .6 mm. No beta blockade and 0.8 mg of sl NTG was given. The 3D data set was reconstructed in 5% intervals of the 67-82 % of the R-R cycle. Diastolic phases were analyzed on a dedicated work station using MPR, MIP and VRT modes. The patient received 80 cc of contrast. Aorta:  Normal size.  No calcifications.  No dissection. Aortic Valve:  Trileaflet.  No calcifications. Coronary Arteries:  Normal coronary origin.  Right dominance. RCA is a large dominant artery that gives rise to PDA and PLVB. There is no plaque. Left main is a large artery that gives rise to LAD, a very small ramus intermedius and LCX arteries. LAD is a medium size artery that has a fairly long severe non-calcified plaque in the proximal portion with associated stenosis that appears > 70%. Mid and distal LAD have small caliber. LCX is a non-dominant artery that gives rise to one large OM1 branch. There is no plaque. Other findings: Normal pulmonary vein drainage into the left atrium. Normal let atrial appendage without a thrombus. Normal size of the pulmonary artery. IMPRESSION: 1. Coronary calcium score of 0. This was 0 percentile for age and sex matched control. 2. Normal coronary origin with right dominance. 3. There is moderate to severe non-calcified plaque in the proximal LAD with associated stenosis that appears > 70%. A cardiac catheterization is recommended. Electronically Signed   By: Ena Dawley   On: 07/08/2017 14:27   Result Date: 07/08/2017 EXAM: OVER-READ INTERPRETATION  CT CHEST The following report is an over-read performed by radiologist Dr. Vinnie Langton of [facility], [place] on 07/08/2017. This over-read does not include interpretation of cardiac or coronary anatomy or pathology. The coronary calcium score/coronary CTA interpretation by the cardiologist is attached. COMPARISON:  None. FINDINGS: Within the visualized portions of the thorax there  are no suspicious appearing pulmonary nodules or masses, there is no acute consolidative airspace disease, no pleural effusions, no pneumothorax and no lymphadenopathy. Visualized portions of the upper abdomen are unremarkable. There are no aggressive appearing lytic or blastic lesions noted in the visualized portions of the skeleton. IMPRESSION: No significant incidental noncardiac findings are noted. Electronically Signed: By: Vinnie Langton M.D. On: 07/08/2017 14:16    Scheduled Meds: . [START ON 07/09/2017] aspirin  81 mg Oral Pre-Cath  . atorvastatin  40 mg Oral q1800  . enoxaparin (LOVENOX) injection  40 mg Subcutaneous Q24H  . iopamidol      . iopamidol      . metoprolol tartrate  50 mg Oral Once  . sodium chloride flush  3 mL Intravenous Q12H    Continuous Infusions: . sodium chloride    . [START ON 07/09/2017] sodium  chloride     Followed by  . [START ON 07/09/2017] sodium chloride       Time spent: 24mins, case discussed with cardiology I have personally reviewed and interpreted on  07/08/2017 daily labs, tele strips, imagings as discussed above under date review session and assessment and plans.  I reviewed all nursing notes, pharmacy notes, consultant notes,  vitals, pertinent old records  I have discussed plan of care as described above with RN , patient and family on 07/08/2017   Florencia Reasons MD, PhD  [facility] Pager [phone]. If 7PM-7AM, please contact night-coverage at [email or web address], password [facility] 07/08/2017, 4:46 PM  LOS: 0 days

## 2017-07-08 NOTE — Progress Notes (Signed)
Pt arrived at the unit at 1850. CHG wipes done. Pt vital signs stable. A/o X4. TR Band has 10 cc of air. Coban added d/t hematoma. Breath sounds clear. HR regular, NSR. No complains of pain. Will continue to monitor.

## 2017-07-08 NOTE — Consult Note (Addendum)
Cardiology Consultation:   Patient ID: Casey Cummings; [id number]; [birth year]   Admit date: 07/07/2017 Date of Consult: 07/08/2017  Primary Care Provider: Patient, No Pcp Per Primary Cardiologist: New to [place]  Primary Electrophysiologist:  None   Patient Profile:   Casey Cummings is a 62 y.o. male with recent hospitalization at [facility] where he presented with complaints of chest pain and was found to have pulmonary nodules concerning for TB (ruled out) and was discharged without a cardiac work-up who is being seen today for the evaluation of chest pain at the request of Dr. Hal Hope.  History of Present Illness:   Mr. Dahm was in his usual state of health until early April when he began experiencing substernal chest tightness with intercourse. He noted some DOE at the time, but denied N/V, lightheadedness, dizziness, diaphoresis, or syncope. He stated his symptoms lasted for a few minutes and resolved spontaneously with rest and deep breathing. He continued to have intermittent episodes of chest tightness first thing in the morning when getting in the shower, as well as episodes at work when having stressful conversations. Yesterday he had an episode of chest tightness and DOE prompting him to leave work early, where he states he had to rest walking to the car, and by the time he got home, he was too weak to bring his work bag inside. He attempted deep breathing exercises, however began to feel lightheaded with numbness in his hands and feet, prompting him to call EMS. EMS suggested his lightheadedness and numbness in extremities were a result of hyperventilation.   He denies prior cardiac history or pertinent PMH. He is fairly active and prior to April has never experienced these symptoms before. He was recently admitted to [facility] from 4/21-23/19 with complaints of CP and DOE where he was found to have apical airspace disease with nodules in the RUL c/f possible TB. Incidentally, he was  noted to have a subtle myocardial fat deposition involving the LV apex which may relate to remote ischemia/infarct.  His PPD and AFBs were negative and he was referred to pulmonary outpatient for further work up. His CP was not addressed during this admission.   He is currently CP free. He denies SOB at rest. He has been sleeping on 3 pillows in an attempt to control his GERD which he states is improved. He denies PND, orthopnea, LE edema, syncope, or dizziness. He drinks a few glasses of wine per week. Denies tobacco or illicit drug use. Family history of CAD in his mother who had her first MI in her 44s and CABG in her 39s.  Hospital course:  VSS. Labs notable for electrolytes wnl, Cr 1.07, Hgb 14.1, PLT 306, TSH wnl, Ddimer wnl, Trop 0.03>0.03><0.03. CXR with with chronic lung changes and emphysema without acute findings; also with continue apical pleuroparenchymal thickening previously recommended for f/u CT chest in 4-6 weeks. EKG with NSR, no STE/D. Cardiology consulted for further recommendations.  History reviewed. No pertinent past medical history.  History reviewed. No pertinent surgical history.   Home Medications:  Prior to Admission medications   Medication Sig Start Date End Date Taking? Authorizing Provider  aspirin 325 MG EC tablet Take 325 mg by mouth every 6 (six) hours as needed for pain.   Yes [provider]  aspirin-acetaminophen-caffeine (EXCEDRIN MIGRAINE) [phone] MG tablet Take 1 tablet by mouth every 6 (six) hours as needed for headache.   Yes [provider]  Multiple Vitamin (MULTIVITAMIN) tablet Take 1 tablet by  mouth daily.   Yes [provider]    Inpatient Medications: Scheduled Meds: . enoxaparin (LOVENOX) injection  40 mg Subcutaneous Q24H  . metoprolol tartrate  50 mg Oral Once   Continuous Infusions:  PRN Meds: acetaminophen **OR** acetaminophen, nitroGLYCERIN, ondansetron **OR** ondansetron (ZOFRAN) IV  Allergies:   No  Known Allergies  Social History:   Social History   Socioeconomic History  . Marital status: Married    Spouse name: Not on file  . Number of children: Not on file  . Years of education: Not on file  . Highest education level: Not on file  Occupational History  . Not on file  Social Needs  . Financial resource strain: Not on file  . Food insecurity:    Worry: Not on file    Inability: Not on file  . Transportation needs:    Medical: Not on file    Non-medical: Not on file  Tobacco Use  . Smoking status: Never Smoker  . Smokeless tobacco: Never Used  Substance and Sexual Activity  . Alcohol use: Yes    Comment: Occasionally.  . Drug use: Never  . Sexual activity: Not on file  Lifestyle  . Physical activity:    Days per week: Not on file    Minutes per session: Not on file  . Stress: Not on file  Relationships  . Social connections:    Talks on phone: Not on file    Gets together: Not on file    Attends religious service: Not on file    Active member of club or organization: Not on file    Attends meetings of clubs or organizations: Not on file    Relationship status: Not on file  . Intimate partner violence:    Fear of current or ex partner: Not on file    Emotionally abused: Not on file    Physically abused: Not on file    Forced sexual activity: Not on file  Other Topics Concern  . Not on file  Social History Narrative  . Not on file    Family History:    Family History  Problem Relation Age of Onset  . CAD Mother      ROS:  Please see the history of present illness.   All other ROS reviewed and negative.     Physical Exam/Data:   Vitals:   07/08/17 0830 07/08/17 0845 07/08/17 0943 07/08/17 0955  BP: 116/73 112/71  (!) 123/91  Pulse: 64 (!) 59  72  Resp: 15 (!) 23  20  Temp:    98.4 F (36.9 C)  TempSrc:    Oral  SpO2: 97% 98%  97%  Weight:   160 lb 9.6 oz (72.8 kg)   Height:   5\' 10"  (1.778 m)    No intake or output data in the 24 hours  ending 07/08/17 1231 Filed Weights   07/08/17 0943  Weight: 160 lb 9.6 oz (72.8 kg)   Body mass index is 23.04 kg/m.  General:  Well nourished, well developed, in no acute distress HEENT: sclera anicteric, MMM Lymph: no adenopathy Neck: no JVD Endocrine:  No thryomegaly Vascular: No carotid bruits; distal pulses 2+ bilaterally Cardiac:  normal S1, S2; RRR; no murmur, gallops, or rubs Lungs:  clear to auscultation bilaterally, no wheezing, rhonchi or rales  Abd: NABS, soft, nontender, no hepatomegaly Ext: no edema Musculoskeletal:  No deformities, BUE and BLE strength normal and equal Skin: warm and dry  Neuro:  CNs  2-12 intact, no focal abnormalities noted Psych:  Normal affect   EKG:  The EKG was personally reviewed and demonstrates:  NSR no STE/D Telemetry:  Telemetry was personally reviewed and demonstrates:  NSR  Relevant CV Studies: None  Laboratory Data:  Chemistry Recent Labs  Lab 07/07/17 1454 07/08/17 0218 07/08/17 0500  NA 138  --  140  K 3.6  --  3.9  CL 108  --  108  CO2 19*  --  26  GLUCOSE 100*  --  94  BUN 14  --  12  CREATININE 1.05 0.87 1.07  CALCIUM 9.1  --  8.8*  GFRNONAA >60 >60 >60  GFRAA >60 >60 >60  ANIONGAP 11  --  6    No results for input(s): PROT, ALBUMIN, AST, ALT, ALKPHOS, BILITOT in the last 168 hours. Hematology Recent Labs  Lab 07/07/17 1454 07/08/17 0218 07/08/17 0500  WBC 6.1 7.6 6.5  RBC 4.79 4.59 4.59  HGB 15.0 14.3 14.1  HCT 42.5 41.7 42.0  MCV 88.7 90.8 91.5  MCH 31.3 31.2 30.7  MCHC 35.3 34.3 33.6  RDW 12.3 12.8 13.0  PLT 334 292 306   Cardiac Enzymes Recent Labs  Lab 07/08/17 0218 07/08/17 0500 07/08/17 1042  TROPONINI 0.03* 0.03* <0.03    Recent Labs  Lab 07/07/17 1548  TROPIPOC 0.02    BNPNo results for input(s): BNP, PROBNP in the last 168 hours.  DDimer  Recent Labs  Lab 07/08/17 0218  DDIMER <0.27    Radiology/Studies:  Dg Chest 2 View  Result Date: 07/07/2017 CLINICAL DATA:   62 year old male with a history of chest pain EXAM: CHEST - 2 VIEW COMPARISON:  06/12/2017 FINDINGS: Cardiomediastinal silhouette unchanged in size and contour. No evidence of central vascular congestion. No pneumothorax or pleural effusion. No confluent airspace disease. Similar appearance of pleuroparenchymal thickening at the bilateral lung apices. Stigmata of emphysema, with increased retrosternal airspace, flattened hemidiaphragms, increased AP diameter, and hyperinflation on the AP view. No displaced fracture IMPRESSION: Chronic lung changes and emphysema, without evidence of acute cardiopulmonary disease. Redemonstration of apical pleuroparenchymal thickening. As was discussed on prior plain film, chest CT is recommended to rule out malignancy. Note that low-dose CT lung cancer screening is recommended for patients who are 86-73 years of age with a 30+ pack-year history of smoking, and who are currently smoking or quit <=15 years ago. Referral for pulmonary consultation and evaluation for potential candidacy may be useful. Electronically Signed   By: Corrie Mckusick D.O.   On: 07/07/2017 15:57    Assessment and Plan:   1. Chest pain: p/w intermittent exertional CP and DOE for the past 3 weeks. Troponin trend flat 0.03>0.03. EKG without ischemic changes. His last ischemic testing was a POET which was negative ~5 years ago.  - Will plan for cardiac CTA to further evaluate CP given no significant PMH and only family history of CAD in his mother, onset in her 84s.  - Echo pending to evaluate LV function and for wall motion abnormalities - Can check lipid panel and Hgb A1C for risk stratification   2. Pulmonary nodules: recently hospitalized for TB r/o. Recommended for repeat CT chest in 4-6 weeks. To follow-up outpatient with Pulmonary 07/22/17   For questions or updates, please contact [place] Please consult [email or web address] for contact info under Cardiology/STEMI.   Signed, Abigail Butts,  PA-C  07/08/2017 12:31 PM [phone]   History and all data above reviewed.  Patient examined.  I agree  with the findings as above.  The patient reports chest pain that has been happening with exertion but increasingly severe and with less exertion over time.  It culminated yesterday in pain while at rest while talking to somebody at work.  It was dull substernal.  No radiation or associated symptoms.  He has not had this before.  He does not have significant risk factors but his mother had "very small arteries" and it was suggested that this could be familial.  He had a negative stress test several years ago.  The patient exam reveals COR:RRR  ,  Lungs: clear  ,  Abd: Positive bowel sounds, no rebound no guarding, Ext No edema  .  All available labs, radiology testing, previous records reviewed. Agree with documented assessment and plan. Chest pain:  Moderate pre test probability of CAD.  Plan is for CTA.  Check lipid profile.  Further management based on these results.    Jeneen Rinks Lativia Velie  1:27 PM  07/08/2017

## 2017-07-08 NOTE — Progress Notes (Addendum)
   Notified by hospitalist service that patient had an episode of CP around 4:35pm while having an echocardiogram. His CP resolved spontaneously after 2-3 minutes. EKG however showed STE in V2-3. Also with Cardiac CTA today with concern for LAD stenosis. STEMI code activated. VSS. Patient will go to the cath lab this evening with Dr. Ellyn Hack.   Patient has been transferred to the cardiology service.  EKG reviewed.  The does appear to be somewhat biphasic ST and T wave changes in the anterior septal leads.  Given the CTA findings, would suspect that he proba  Upon arrival to the Cath Lab, he is chest pain-free.  Glenetta Hew, MD

## 2017-07-08 NOTE — Plan of Care (Signed)
  Problem: Education: Goal: Knowledge of General Education information will improve Outcome: Progressing   Problem: Health Behavior/Discharge Planning: Goal: Ability to manage health-related needs will improve Outcome: Progressing   Problem: Clinical Measurements: Goal: Ability to maintain clinical measurements within normal limits will improve Outcome: Progressing Goal: Will remain free from infection Outcome: Progressing Goal: Diagnostic test results will improve Outcome: Progressing Goal: Respiratory complications will improve Outcome: Progressing Goal: Cardiovascular complication will be avoided Outcome: Progressing   Problem: Activity: Goal: Risk for activity intolerance will decrease Outcome: Progressing   Problem: Nutrition: Goal: Adequate nutrition will be maintained Outcome: Progressing   Problem: Coping: Goal: Level of anxiety will decrease Outcome: Progressing   Problem: Elimination: Goal: Will not experience complications related to bowel motility Outcome: Progressing Goal: Will not experience complications related to urinary retention Outcome: Progressing   Problem: Pain Managment: Goal: General experience of comfort will improve Outcome: Progressing   Problem: Safety: Goal: Ability to remain free from injury will improve Outcome: Progressing   Problem: Skin Integrity: Goal: Risk for impaired skin integrity will decrease Outcome: Progressing   Problem: Education: Goal: Understanding of cardiac disease, CV risk reduction, and recovery process will improve Outcome: Progressing Goal: Understanding of medication regimen will improve Outcome: Progressing   Problem: Activity: Goal: Ability to tolerate increased activity will improve Outcome: Progressing   Problem: Cardiac: Goal: Ability to achieve and maintain adequate cardiopulmonary perfusion will improve Outcome: Progressing Goal: Vascular access site(s) Level 0-1 will be maintained Outcome:  Progressing   Problem: Health Behavior/Discharge Planning: Goal: Ability to safely manage health-related needs after discharge will improve Outcome: Progressing   

## 2017-07-08 NOTE — Interval H&P Note (Signed)
History and Physical Interval Note:  07/08/2017 5:42 PM  Casey Cummings  has presented today for surgery, with the diagnosis of CP-? Anterior STEMI  Cardiac CTA was concerning for moderate to severe non-calcified plaque in the proximal LAD with associated stenosis that appears >70%. He is recommended for cardiac catheterization for further investigation. Dr. Percival Spanish discussed risks and patient is in agreement to undergo LHC tomorrow. Cath orders are in. NPO after MN tonight. Patient is 2nd case with Dr. Saunders Revel on 07/09/17.   @ 1730 -- Cardiology team called: Notified by hospitalist service that patient had an episode of CP around 4:35pm while having an echocardiogram. His CP resolved spontaneously after 2-3 minutes. EKG however showed STE in V2-3. Also with Cardiac CTA today with concern for LAD stenosis. STEMI code activated. VSS. Patient will go to the cath lab this evening with Dr. Ellyn Hack.   Patient has been transferred to the cardiology service.             The various methods of treatment have been discussed with the patient and family. After consideration of risks, benefits and other options for treatment, the patient has consented to  Procedure(s): LEFT HEART CATH AND CORONARY ANGIOGRAPHY (N/A)  With possible PERCUTANEOUS CORONARY INTERVENTION as a surgical intervention .  The patient's history has been reviewed, patient examined, no change in status, stable for surgery.  I have reviewed the patient's chart and labs.  Questions were answered to the patient's satisfaction.    Cath Lab Visit (complete for each Cath Lab visit)  Clinical Evaluation Leading to the Procedure:   ACS: Yes.    Non-ACS:    Anginal Classification: CCS IV  Anti-ischemic medical therapy: No Therapy  Non-Invasive Test Results: High-risk stress test findings: cardiac mortality >3%/year  Prior CABG: No previous CABG   Glenetta Hew

## 2017-07-09 ENCOUNTER — Encounter (HOSPITAL_COMMUNITY): Payer: Self-pay | Admitting: Cardiology

## 2017-07-09 ENCOUNTER — Telehealth: Payer: Self-pay

## 2017-07-09 DIAGNOSIS — R911 Solitary pulmonary nodule: Secondary | ICD-10-CM | POA: Diagnosis present

## 2017-07-09 DIAGNOSIS — I2109 ST elevation (STEMI) myocardial infarction involving other coronary artery of anterior wall: Secondary | ICD-10-CM | POA: Diagnosis present

## 2017-07-09 DIAGNOSIS — R079 Chest pain, unspecified: Secondary | ICD-10-CM | POA: Diagnosis present

## 2017-07-09 DIAGNOSIS — Z8249 Family history of ischemic heart disease and other diseases of the circulatory system: Secondary | ICD-10-CM | POA: Diagnosis not present

## 2017-07-09 DIAGNOSIS — I10 Essential (primary) hypertension: Secondary | ICD-10-CM | POA: Diagnosis present

## 2017-07-09 DIAGNOSIS — E785 Hyperlipidemia, unspecified: Secondary | ICD-10-CM | POA: Diagnosis present

## 2017-07-09 DIAGNOSIS — J439 Emphysema, unspecified: Secondary | ICD-10-CM | POA: Diagnosis present

## 2017-07-09 DIAGNOSIS — K219 Gastro-esophageal reflux disease without esophagitis: Secondary | ICD-10-CM | POA: Diagnosis present

## 2017-07-09 DIAGNOSIS — I2510 Atherosclerotic heart disease of native coronary artery without angina pectoris: Secondary | ICD-10-CM | POA: Diagnosis present

## 2017-07-09 LAB — BASIC METABOLIC PANEL
ANION GAP: 7 (ref 5–15)
BUN: 14 mg/dL (ref 6–20)
CHLORIDE: 110 mmol/L (ref 101–111)
CO2: 21 mmol/L — ABNORMAL LOW (ref 22–32)
Calcium: 8.4 mg/dL — ABNORMAL LOW (ref 8.9–10.3)
Creatinine, Ser: 0.95 mg/dL (ref 0.61–1.24)
Glucose, Bld: 83 mg/dL (ref 65–99)
Potassium: 3.9 mmol/L (ref 3.5–5.1)
SODIUM: 138 mmol/L (ref 135–145)

## 2017-07-09 LAB — CBC
HEMATOCRIT: 41.9 % (ref 39.0–52.0)
Hemoglobin: 14.1 g/dL (ref 13.0–17.0)
MCH: 30.9 pg (ref 26.0–34.0)
MCHC: 33.7 g/dL (ref 30.0–36.0)
MCV: 91.9 fL (ref 78.0–100.0)
Platelets: 289 10*3/uL (ref 150–400)
RBC: 4.56 MIL/uL (ref 4.22–5.81)
RDW: 13 % (ref 11.5–15.5)
WBC: 8 10*3/uL (ref 4.0–10.5)

## 2017-07-09 LAB — TROPONIN I: Troponin I: 0.14 ng/mL (ref <0.03)

## 2017-07-09 MED ORDER — NITROGLYCERIN 0.4 MG SL SUBL: 0.4000 mg | SUBLINGUAL_TABLET | SUBLINGUAL | 3 refills | Status: DC | PRN

## 2017-07-09 MED ORDER — ASPIRIN 81 MG PO CHEW: 81.0000 mg | CHEWABLE_TABLET | Freq: Every day | ORAL | 3 refills | Status: AC

## 2017-07-09 MED ORDER — TICAGRELOR 90 MG PO TABS: 90.0000 mg | ORAL_TABLET | Freq: Two times a day (BID) | ORAL | 3 refills | Status: DC

## 2017-07-09 MED ORDER — TICAGRELOR 90 MG PO TABS: 90.0000 mg | ORAL_TABLET | Freq: Two times a day (BID) | ORAL | 0 refills | Status: DC

## 2017-07-09 MED ORDER — ATORVASTATIN CALCIUM 80 MG PO TABS: 80.0000 mg | ORAL_TABLET | Freq: Every day | ORAL | 3 refills | Status: DC

## 2017-07-09 NOTE — Telephone Encounter (Signed)
Pt is still in the hospital-call Monday 07-12-17

## 2017-07-09 NOTE — Discharge Instructions (Signed)
PLEASE REMEMBER TO BRING ALL OF YOUR MEDICATIONS TO EACH OF YOUR FOLLOW-UP OFFICE VISITS.  PLEASE ATTEND ALL SCHEDULED FOLLOW-UP APPOINTMENTS.   Activity: Increase activity slowly as tolerated. You may shower, but no soaking baths (or swimming) for 1 week. No driving for 24 hours. No lifting over 5 lbs for 1 week. No sexual activity for 1 week.   You May Return to Work: in 1 week (if applicable)  Wound Care: You may wash cath site gently with soap and water. Keep cath site clean and dry. If you notice pain, swelling, bleeding or pus at your cath site, please call [phone].   It is very important that you DO NOT miss any doses of your Brilinta. Missing doses puts you at risk of developing blockages in your heart stent.

## 2017-07-09 NOTE — Discharge Summary (Addendum)
Discharge Summary    Patient ID: Casey Cummings,  MRN: [id number], DOB/AGE: [birth year] 62 y.o.  Admit date: 07/07/2017 Discharge date: 07/09/2017  Primary Care Provider: Patient, No Pcp Per Primary Cardiologist: Minus Breeding, MD  Discharge Diagnoses    Principal Problem:   Acute ST elevation myocardial infarction (STEMI) of anterior wall [facility]) Active Problems:   Chest pain   Pulmonary nodule   Abnormal findings on diagnostic imaging of cardiovascular system   Acute anterior wall MI ([place])   Dyslipidemia   Allergies No Known Allergies  Diagnostic Studies/Procedures    Left Heart Catheterization 07/08/17: Conclusion     Culprit lesion: Prox LAD lesion is 90% stenosed.  A drug-eluting stent was successfully placed using a STENT SYNERGY DES 3.5X16. Postdilated in tapered fashion to 4.1-3.9 mm  Post intervention, there is a 0% residual stenosis.  Mid LAD to Dist LAD lesion is 40% stenosed. The downstream LAD is small caliber diffusely diseased.  The left ventricular systolic function is normal. The left ventricular ejection fraction is 55-65% by visual estimate.  LV end diastolic pressure is normal.   Severe single-vessel disease status post PCI with DES stent.  He will be transferred to the ICU overnight based on the acute nature of his presentation.  Plan: DAPT x 1 yr Potential discharge tomorrow if stable Aggressive risk factor medication with statin, beta-blocker and possibly ARB.   Echocardiogram 07/08/17: Study Conclusions  - Left ventricle: The cavity size was normal. Wall thickness was   increased in a pattern of mild LVH. Systolic function was   vigorous. The estimated ejection fraction was in the range of 65%   to 70%. Wall motion was normal; there were no regional wall   motion abnormalities. Doppler parameters are consistent with   abnormal left ventricular relaxation (grade 1 diastolic   dysfunction). The E/e&' ratio is between 8-15,  suggesting   indeterminate LV filling pressure. - Left atrium: The atrium was normal in size. - Inferior vena cava: The vessel was normal in size. The   respirophasic diameter changes were in the normal range (>= 50%),   consistent with normal central venous pressure.  Impressions:  - LVEF 65-70%, mild LVH, normal wall motion, grade 1 DD,   indeterminate LV filling pressure, normal LA size, normal IVC.  Cardiac CTA 07/08/17: IMPRESSION: 1. Coronary calcium score of 0. This was 0 percentile for age and sex matched control.  2. Normal coronary origin with right dominance.  3. There is moderate to severe non-calcified plaque in the proximal LAD with associated stenosis that appears > 70%. A cardiac catheterization is recommended.  _____________   History of Present Illness     Casey Cummings was in his usual state of health until early April when he began experiencing substernal chest tightness with intercourse. He noted some DOE at the time, but denied N/V, lightheadedness, dizziness, diaphoresis, or syncope. He stated his symptoms lasted for a few minutes and resolved spontaneously with rest and deep breathing. He continued to have intermittent episodes of chest tightness first thing in the morning when getting in the shower, as well as episodes at work when having stressful conversations. Yesterday he had an episode of chest tightness and DOE prompting him to leave work early, where he states he had to rest walking to the car, and by the time he got home, he was too weak to bring his work bag inside. He attempted deep breathing exercises, however began to feel lightheaded with numbness in  his hands and feet, prompting him to call EMS. EMS suggested his lightheadedness and numbness in extremities were a result of hyperventilation.   He denies prior cardiac history or pertinent PMH. He is fairly active and prior to April has never experienced these symptoms before. He was recently  admitted to [facility] from 4/21-23/19 with complaints of CP and DOE where he was found to have apical airspace disease with nodules in the RUL c/f possible TB. Incidentally, he was noted to have a subtle myocardial fat deposition involving the LV apex which may relate to remote ischemia/infarct.  His PPD and AFBs were negative and he was referred to pulmonary outpatient for further work up. His CP was not addressed during this admission.   At the time of consultation he was CP free. He denies SOB at rest. He has been sleeping on 3 pillows in an attempt to control his GERD which he states is improved. He denies PND, orthopnea, LE edema, syncope, or dizziness. He drinks a few glasses of wine per week. Denies tobacco or illicit drug use. Family history of CAD in his mother who had her first MI in her 31s and CABG in her 75s.  Hospital course at the time of consultation:  VSS. Labs notable for electrolytes wnl, Cr 1.07, Hgb 14.1, PLT 306, TSH wnl, Ddimer wnl, Trop 0.03>0.03><0.03. CXR with with chronic lung changes and emphysema without acute findings; also with continue apical pleuroparenchymal thickening previously recommended for f/u CT chest in 4-6 weeks. EKG with NSR, no STE/D.    Hospital Course     Consultants: None   Casey Cummings presented with complaints of exertional chest pain over the past several weeks. He had no prior cardiac history. Echo with EF 65-70%, G1DD, no wall motion abnormalities, and no significant valvular abnormalities. He underwent Cardiac CTA given low risk factors and was found to have high-grade stenosis of his LAD. He subsequently had another episode of CP while admitted. EKG with acute anterior ST elevation and code STEMI was activated. Patient underwent LHC 07/08/17 with 90% stenosis of LAD with successful PCI/DES to LAD. He was recommended for DAPT x1 year. Troponin peaked at 0.14. EKG following LHC with resolution of STE. Labs stable on the day of discharge.  1. Anterior  STEMI: s/p PCI/DES to LAD 07/08/17 - Started on ASA, brilinta, and statin  2. Dyslipidemia: LDL 102; goal <70 - Started on atorvastatin 80mg  - recheck LFTs in 6-8 weeks  _____________  Discharge Vitals Blood pressure 134/85, pulse 68, temperature 98.5 F (36.9 C), temperature source Oral, resp. rate 19, height 5\' 10"  (1.778 m), weight 160 lb 9.6 oz (72.8 kg), SpO2 99 %.  Filed Weights   07/08/17 0943  Weight: 160 lb 9.6 oz (72.8 kg)    Labs & Radiologic Studies    CBC Recent Labs    07/08/17 0500 07/09/17 0502  WBC 6.5 8.0  HGB 14.1 14.1  HCT 42.0 41.9  MCV 91.5 91.9  PLT 306 671   Basic Metabolic Panel Recent Labs    07/08/17 0218 07/08/17 0500 07/09/17 0502  NA  --  140 138  K  --  3.9 3.9  CL  --  108 110  CO2  --  26 21*  GLUCOSE  --  94 83  BUN  --  12 14  CREATININE 0.87 1.07 0.95  CALCIUM  --  8.8* 8.4*  MG 2.0  --   --    Liver Function Tests No results for input(s):  AST, ALT, ALKPHOS, BILITOT, PROT, ALBUMIN in the last 72 hours. No results for input(s): LIPASE, AMYLASE in the last 72 hours. Cardiac Enzymes Recent Labs    07/08/17 1704 07/08/17 2237 07/09/17 0502  TROPONINI <0.03 0.04* 0.14*   BNP Invalid input(s): POCBNP D-Dimer Recent Labs    07/08/17 0218  DDIMER <0.27   Hemoglobin A1C Recent Labs    07/08/17 0500  HGBA1C 5.6   Fasting Lipid Panel Recent Labs    07/08/17 1244  CHOL 175  HDL 48  LDLCALC 102*  TRIG 126  CHOLHDL 3.6   Thyroid Function Tests Recent Labs    07/08/17 0219  TSH 3.323   _____________  Dg Chest 2 View  Result Date: 07/07/2017 CLINICAL DATA:  62 year old male with a history of chest pain EXAM: CHEST - 2 VIEW COMPARISON:  06/12/2017 FINDINGS: Cardiomediastinal silhouette unchanged in size and contour. No evidence of central vascular congestion. No pneumothorax or pleural effusion. No confluent airspace disease. Similar appearance of pleuroparenchymal thickening at the bilateral lung apices.  Stigmata of emphysema, with increased retrosternal airspace, flattened hemidiaphragms, increased AP diameter, and hyperinflation on the AP view. No displaced fracture IMPRESSION: Chronic lung changes and emphysema, without evidence of acute cardiopulmonary disease. Redemonstration of apical pleuroparenchymal thickening. As was discussed on prior plain film, chest CT is recommended to rule out malignancy. Note that low-dose CT lung cancer screening is recommended for patients who are 68-48 years of age with a 30+ pack-year history of smoking, and who are currently smoking or quit <=15 years ago. Referral for pulmonary consultation and evaluation for potential candidacy may be useful. Electronically Signed   By: Corrie Mckusick D.O.   On: 07/07/2017 15:57   Ct Coronary Morph W/cta Cor W/score W/ca W/cm &/or Wo/cm  Addendum Date: 07/08/2017   ADDENDUM REPORT: 07/08/2017 14:27 CLINICAL DATA:  62 year old male with exertional chest pain and family h/o premature CAD. EXAM: Cardiac/Coronary  CT TECHNIQUE: The patient was scanned on a [employer]. FINDINGS: A 120 kV prospective scan was triggered in the descending thoracic aorta at 111 HU's. Axial non-contrast 3 mm slices were carried out through the heart. The data set was analyzed on a dedicated work station and scored using the [place]. Gantry rotation speed was 250 msecs and collimation was .6 mm. No beta blockade and 0.8 mg of sl NTG was given. The 3D data set was reconstructed in 5% intervals of the 67-82 % of the R-R cycle. Diastolic phases were analyzed on a dedicated work station using MPR, MIP and VRT modes. The patient received 80 cc of contrast. Aorta:  Normal size.  No calcifications.  No dissection. Aortic Valve:  Trileaflet.  No calcifications. Coronary Arteries:  Normal coronary origin.  Right dominance. RCA is a large dominant artery that gives rise to PDA and PLVB. There is no plaque. Left main is a large artery that gives rise to LAD, a  very small ramus intermedius and LCX arteries. LAD is a medium size artery that has a fairly long severe non-calcified plaque in the proximal portion with associated stenosis that appears > 70%. Mid and distal LAD have small caliber. LCX is a non-dominant artery that gives rise to one large OM1 branch. There is no plaque. Other findings: Normal pulmonary vein drainage into the left atrium. Normal let atrial appendage without a thrombus. Normal size of the pulmonary artery. IMPRESSION: 1. Coronary calcium score of 0. This was 0 percentile for age and sex matched control. 2. Normal coronary  origin with right dominance. 3. There is moderate to severe non-calcified plaque in the proximal LAD with associated stenosis that appears > 70%. A cardiac catheterization is recommended. Electronically Signed   By: Ena Dawley   On: 07/08/2017 14:27   Result Date: 07/08/2017 EXAM: OVER-READ INTERPRETATION  CT CHEST The following report is an over-read performed by radiologist Dr. Vinnie Langton of [facility], [place] on 07/08/2017. This over-read does not include interpretation of cardiac or coronary anatomy or pathology. The coronary calcium score/coronary CTA interpretation by the cardiologist is attached. COMPARISON:  None. FINDINGS: Within the visualized portions of the thorax there are no suspicious appearing pulmonary nodules or masses, there is no acute consolidative airspace disease, no pleural effusions, no pneumothorax and no lymphadenopathy. Visualized portions of the upper abdomen are unremarkable. There are no aggressive appearing lytic or blastic lesions noted in the visualized portions of the skeleton. IMPRESSION: No significant incidental noncardiac findings are noted. Electronically Signed: By: Vinnie Langton M.D. On: 07/08/2017 14:16   Disposition   Patient was seen and examined by Dr. Percival Spanish who deemed patient as stable for discharge. Follow-up has been arranged. Discharge medications as listed  below.   Follow-up Plans & Appointments    Follow-up Information    Barrett, Evelene Croon, PA-C Follow up on 07/20/2017.   Specialties:  Cardiology, Radiology Why:  Please arrive 15 minutes early for you 9am Cardiology appointment Contact information: [address] [place] [address] [phone]          Discharge Instructions    AMB Referral to Cardiac Rehabilitation - Phase II   Complete by:  As directed    Diagnosis:   Coronary Stents STEMI     Diet - low sodium heart healthy   Complete by:  As directed    Increase activity slowly   Complete by:  As directed       Discharge Medications   Allergies as of 07/09/2017   No Known Allergies     Medication List    STOP taking these medications   aspirin 325 MG EC tablet Replaced by:  aspirin 81 MG chewable tablet     TAKE these medications   aspirin 81 MG chewable tablet Chew 1 tablet (81 mg total) by mouth daily. Start taking on:  07/10/2017 Replaces:  aspirin 325 MG EC tablet   aspirin-acetaminophen-caffeine 250-250-65 MG tablet Commonly known as:  EXCEDRIN MIGRAINE Take 1 tablet by mouth every 6 (six) hours as needed for headache.   atorvastatin 80 MG tablet Commonly known as:  LIPITOR Take 1 tablet (80 mg total) by mouth daily at 6 PM.   multivitamin tablet Take 1 tablet by mouth daily.   nitroGLYCERIN 0.4 MG SL tablet Commonly known as:  NITROSTAT Place 1 tablet (0.4 mg total) under the tongue every 5 (five) minutes as needed for chest pain.   ticagrelor 90 MG Tabs tablet Commonly known as:  BRILINTA Take 1 tablet (90 mg total) by mouth 2 (two) times daily.   ticagrelor 90 MG Tabs tablet Commonly known as:  BRILINTA Take 1 tablet (90 mg total) by mouth 2 (two) times daily.        Aspirin prescribed at discharge?  Yes High Intensity Statin Prescribed? (Lipitor 40-80mg  or Crestor 20-40mg ): Yes Beta Blocker Prescribed? No: BP stable For EF <40%, was ACEI/ARB Prescribed? No: EF  65-70% ADP Receptor Inhibitor Prescribed? (i.e. Plavix etc.-Includes Medically Managed Patients): Yes For EF <40%, Aldosterone Inhibitor Prescribed? No: EF 65-70% Was EF  assessed during THIS hospitalization? Yes Was Cardiac Rehab II ordered? (Included Medically managed Patients): No: Cardiac Rehab 1 was ordered   Outstanding Labs/Studies   Recommend LFTs in 6-8 weeks after initiation of statin therapy  Duration of Discharge Encounter   Greater than 30 minutes including physician time.  Signed, Abigail Butts PA-C 07/09/2017, 10:14 AM

## 2017-07-09 NOTE — Progress Notes (Addendum)
Benefit check submitted for Brilinta, result pending. Spoke w Truman Hayward at [facility] where Rx has been filled and she stated it was a $0 copay. Patient and wife given copay card as per protocol. No other CM needs.

## 2017-07-09 NOTE — Progress Notes (Signed)
CRITICAL VALUE ALERT  Critical Value:  Troponin 0.04  Date & Time Notied:  07/09/2017 0017  Provider Notified: MD Carncelli  Pt had recent heart catheterization; No new orders

## 2017-07-09 NOTE — Progress Notes (Signed)
CARDIAC REHAB PHASE I   PRE:  Rate/Rhythm: 74 SR BP:  Supine: 128/81  Sitting:   Standing:    SaO2: 97%RA  MODE:  Ambulation: 740 ft   POST:  Rate/Rhythm: 78 SR  BP:  Supine:   Sitting: 157/81  Standing:    SaO2: 100%RA 1020-1112 Pt walked 740 ft on RA with hand held asst. Gait a little wobbly the first 370 ft but got steadier the next 370 ft. Pt denied feeling weak on either side or like he might fall. He stated legs just felt a little weak. Encouraged to get up slowly and walking instructions given. Discussed with RN and suggested pt walk again before discharge. MI education completed with pt and wife who voiced understanding. Stressed importance of brilinta with stent. Needs to see case manager. Reviewed MI restrictions, NTG use, gave heart healthy diet, ex ed and CRP 2. Referred to [facility] program.    Graylon Good, RN BSN  07/09/2017 11:08 AM

## 2017-07-09 NOTE — Progress Notes (Signed)
Discharge instructions given to patient at this time. No unanswered questions.

## 2017-07-09 NOTE — Progress Notes (Addendum)
Progress Note  Patient Name: Casey Cummings Date of Encounter: 07/09/2017  Primary Cardiologist:   No primary care provider on file.   Subjective   No chest pain.  No SOB  Inpatient Medications    Scheduled Meds: . aspirin  81 mg Oral Daily  . atorvastatin  80 mg Oral q1800  . metoprolol tartrate  50 mg Oral Once  . sodium chloride flush  3 mL Intravenous Q12H  . ticagrelor  90 mg Oral BID   Continuous Infusions: . sodium chloride     PRN Meds: sodium chloride, acetaminophen, nitroGLYCERIN, ondansetron (ZOFRAN) IV, sodium chloride flush   Vital Signs    Vitals:   07/09/17 0300 07/09/17 0343 07/09/17 0400 07/09/17 0500  BP: 107/76  108/75 114/72  Pulse: 60  (!) 58 67  Resp: 15  11 13   Temp:  98.3 F (36.8 C)    TempSrc:  Oral    SpO2: 97%  96% 96%  Weight:      Height:        Intake/Output Summary (Last 24 hours) at 07/09/2017 0621 Last data filed at 07/09/2017 0100 Gross per 24 hour  Intake 960 ml  Output -  Net 960 ml   Filed Weights   07/08/17 0943  Weight: 160 lb 9.6 oz (72.8 kg)    Telemetry    NSR - Personally Reviewed  ECG    Resolution of ST elevation, NSR - Personally Reviewed  Physical Exam   GEN: No acute distress.   Neck: No  JVD Cardiac: RRR, no murmurs, rubs, or gallops.  Respiratory: Clear  to auscultation bilaterally. GI: Soft, nontender, non-distended  MS: No  edema; No deformity.   Right radial with slight echymosis and swelling Neuro:  Nonfocal  Psych: Normal affect   Labs    Chemistry Recent Labs  Lab 07/07/17 1454 07/08/17 0218 07/08/17 0500  NA 138  --  140  K 3.6  --  3.9  CL 108  --  108  CO2 19*  --  26  GLUCOSE 100*  --  94  BUN 14  --  12  CREATININE 1.05 0.87 1.07  CALCIUM 9.1  --  8.8*  GFRNONAA >60 >60 >60  GFRAA >60 >60 >60  ANIONGAP 11  --  6     Hematology Recent Labs  Lab 07/07/17 1454 07/08/17 0218 07/08/17 0500  WBC 6.1 7.6 6.5  RBC 4.79 4.59 4.59  HGB 15.0 14.3 14.1  HCT 42.5 41.7  42.0  MCV 88.7 90.8 91.5  MCH 31.3 31.2 30.7  MCHC 35.3 34.3 33.6  RDW 12.3 12.8 13.0  PLT 334 292 306    Cardiac Enzymes Recent Labs  Lab 07/08/17 0500 07/08/17 1042 07/08/17 1704 07/08/17 2237  TROPONINI 0.03* <0.03 <0.03 0.04*    Recent Labs  Lab 07/07/17 1548  TROPIPOC 0.02     BNPNo results for input(s): BNP, PROBNP in the last 168 hours.   DDimer  Recent Labs  Lab 07/08/17 0218  DDIMER <0.27     Radiology    Dg Chest 2 View  Result Date: 07/07/2017 CLINICAL DATA:  62 year old male with a history of chest pain EXAM: CHEST - 2 VIEW COMPARISON:  06/12/2017 FINDINGS: Cardiomediastinal silhouette unchanged in size and contour. No evidence of central vascular congestion. No pneumothorax or pleural effusion. No confluent airspace disease. Similar appearance of pleuroparenchymal thickening at the bilateral lung apices. Stigmata of emphysema, with increased retrosternal airspace, flattened hemidiaphragms, increased AP diameter, and hyperinflation on  the AP view. No displaced fracture IMPRESSION: Chronic lung changes and emphysema, without evidence of acute cardiopulmonary disease. Redemonstration of apical pleuroparenchymal thickening. As was discussed on prior plain film, chest CT is recommended to rule out malignancy. Note that low-dose CT lung cancer screening is recommended for patients who are 31-59 years of age with a 30+ pack-year history of smoking, and who are currently smoking or quit <=15 years ago. Referral for pulmonary consultation and evaluation for potential candidacy may be useful. Electronically Signed   By: Corrie Mckusick D.O.   On: 07/07/2017 15:57   Ct Coronary Morph W/cta Cor W/score W/ca W/cm &/or Wo/cm  Addendum Date: 07/08/2017   ADDENDUM REPORT: 07/08/2017 14:27 CLINICAL DATA:  62 year old male with exertional chest pain and family h/o premature CAD. EXAM: Cardiac/Coronary  CT TECHNIQUE: The patient was scanned on a [employer]. FINDINGS: A 120  kV prospective scan was triggered in the descending thoracic aorta at 111 HU's. Axial non-contrast 3 mm slices were carried out through the heart. The data set was analyzed on a dedicated work station and scored using the [employer]. Gantry rotation speed was 250 msecs and collimation was .6 mm. No beta blockade and 0.8 mg of sl NTG was given. The 3D data set was reconstructed in 5% intervals of the 67-82 % of the R-R cycle. Diastolic phases were analyzed on a dedicated work station using MPR, MIP and VRT modes. The patient received 80 cc of contrast. Aorta:  Normal size.  No calcifications.  No dissection. Aortic Valve:  Trileaflet.  No calcifications. Coronary Arteries:  Normal coronary origin.  Right dominance. RCA is a large dominant artery that gives rise to PDA and PLVB. There is no plaque. Left main is a large artery that gives rise to LAD, a very small ramus intermedius and LCX arteries. LAD is a medium size artery that has a fairly long severe non-calcified plaque in the proximal portion with associated stenosis that appears > 70%. Mid and distal LAD have small caliber. LCX is a non-dominant artery that gives rise to one large OM1 branch. There is no plaque. Other findings: Normal pulmonary vein drainage into the left atrium. Normal let atrial appendage without a thrombus. Normal size of the pulmonary artery. IMPRESSION: 1. Coronary calcium score of 0. This was 0 percentile for age and sex matched control. 2. Normal coronary origin with right dominance. 3. There is moderate to severe non-calcified plaque in the proximal LAD with associated stenosis that appears > 70%. A cardiac catheterization is recommended. Electronically Signed   By: Ena Dawley   On: 07/08/2017 14:27   Result Date: 07/08/2017 EXAM: OVER-READ INTERPRETATION  CT CHEST The following report is an over-read performed by radiologist Dr. Vinnie Langton of [facility], [place] on 07/08/2017. This over-read does not include  interpretation of cardiac or coronary anatomy or pathology. The coronary calcium score/coronary CTA interpretation by the cardiologist is attached. COMPARISON:  None. FINDINGS: Within the visualized portions of the thorax there are no suspicious appearing pulmonary nodules or masses, there is no acute consolidative airspace disease, no pleural effusions, no pneumothorax and no lymphadenopathy. Visualized portions of the upper abdomen are unremarkable. There are no aggressive appearing lytic or blastic lesions noted in the visualized portions of the skeleton. IMPRESSION: No significant incidental noncardiac findings are noted. Electronically Signed: By: Vinnie Langton M.D. On: 07/08/2017 14:16    Cardiac Studies   Cardiac cath   May 2  CORONARY/GRAFT ACUTE MI REVASCULARIZATION  LEFT  HEART CATH AND CORONARY ANGIOGRAPHY  Conclusion     Culprit lesion: Prox LAD lesion is 90% stenosed.  A drug-eluting stent was successfully placed using a STENT SYNERGY DES 3.5X16. Postdilated in tapered fashion to 4.1-3.9 mm  Post intervention, there is a 0% residual stenosis.  Mid LAD to Dist LAD lesion is 40% stenosed. The downstream LAD is small caliber diffusely diseased.  The left ventricular systolic function is normal. The left ventricular ejection fraction is 55-65% by visual estimate.  LV end diastolic pressure is normal.   Severe single-vessel disease status post PCI with DES stent.      Echo May 2:    Study Conclusions  - Left ventricle: The cavity size was normal. Wall thickness was   increased in a pattern of mild LVH. Systolic function was   vigorous. The estimated ejection fraction was in the range of 65%   to 70%. Wall motion was normal; there were no regional wall   motion abnormalities. Doppler parameters are consistent with   abnormal left ventricular relaxation (grade 1 diastolic   dysfunction). The E/e&' ratio is between 8-15, suggesting   indeterminate LV filling  pressure. - Left atrium: The atrium was normal in size. - Inferior vena cava: The vessel was normal in size. The   respirophasic diameter changes were in the normal range (>= 50%),   consistent with normal central venous pressure.   Patient Profile     62 y.o. male with recent hospitalization at [facility] where he presented with complaints of chest pain and was found to have pulmonary nodules concerning for TB (ruled out) and was discharged without a cardiac work-up who is being seen for the evaluation of chest pain at the request of Dr. Hal Hope.  Found to have high grade LAD stenosis on CT and subsequently had acute anterior ST elevation and treated with stenting as a STEMI.   Assessment & Plan    ACS:  Stent to LAD.  OK to discharge home today pending lab results.  Meds as on MAR.   DYSLIPIDEMIA:  Statin started.    Note the patient was changed to in patient status because of the severity of is illness nature of the procedure treatement of acute anterior MI.   For questions or updates, please contact [place] Please consult [email or web address] for contact info under Cardiology/STEMI.   Signed, Minus Breeding, MD  07/09/2017, 6:21 AM

## 2017-07-12 ENCOUNTER — Telehealth (HOSPITAL_COMMUNITY): Payer: Self-pay

## 2017-07-12 MED FILL — Heparin Sod (Porcine)-NaCl IV Soln 1000 Unit/500ML-0.9%: INTRAVENOUS | Qty: 1000 | Status: AC

## 2017-07-12 NOTE — Telephone Encounter (Signed)
Patients insurance is active and benefits verified through [employer] - No co-pay, deductible amount of $4,000/$937.03 has been met, out of pocket amount of $6,850/$937.03 has been met, 20% co-insurance, and no pre-authorization is required. Passport/reference [phone]  Will contact patient to see if he is interested in the Cardiac Rehab Program. If interested, patient will need to complete follow up appt. Once completed, patient will be contacted for scheduling upon review by the RN Navigator.

## 2017-07-12 NOTE — Telephone Encounter (Signed)
Patient contacted regarding discharge from [facility] on 07/09/17.  Patient understands to follow up with provider Rosaria Ferries, PA on 07/20/17 at 9 am  at the [facility] office. Patient understands discharge instructions? yes Patient understands medications and regiment? yes Patient understands to bring all medications to this visit? yes  Patient stated that he feels well and is resting. He has been instructed to call if he has any questions.

## 2017-07-12 NOTE — Telephone Encounter (Signed)
Attempted to call patient to see if he is interested in the Cardiac Rehab Program - lm on vm °

## 2017-07-13 ENCOUNTER — Telehealth (HOSPITAL_COMMUNITY): Payer: Self-pay

## 2017-07-13 NOTE — Telephone Encounter (Signed)
Wife of patient returned call yesterday afternoon. Returned call this morning and spoke with patient. Patient stated he is interested in the Cardiac Rehab Program. Explained scheduling process and went over insurance with patient and verbalized understanding. Will contact patient for scheduling once follow up appt has been completed.

## 2017-07-15 ENCOUNTER — Telehealth: Payer: Self-pay | Admitting: Physician Assistant

## 2017-07-15 NOTE — Telephone Encounter (Signed)
New Message:   Pt  Wants to know if he should return to work before he have his f/u visit?  His primary doctor thought he should see his Cardiologist before retuning to work. He has an appt with Rosaria Ferries on Tuesday(07-20-17).

## 2017-07-15 NOTE — Telephone Encounter (Signed)
Spoke with pt, advised to wait until follow up appointment to see if okay to return to work.

## 2017-07-19 ENCOUNTER — Encounter: Payer: Self-pay | Admitting: Physician Assistant

## 2017-07-19 NOTE — Progress Notes (Signed)
Cardiology Office Note   Date:  07/20/2017   ID:  Casey Cummings, DOB 01/29/1956, MRN [id number]  PCP:  Francesca Oman, DO  Cardiologist:  Dr Percival Spanish, 07/09/2017 in-hospital  Rosaria Ferries, PA-C   Chief Complaint  Patient presents with  . Hospitalization Follow-up    education information on nitro medication, pt denies chest pains during the day at night it becomes worse, slight SOB when laying down, denies swelling in hands/feet    History of Present Illness: Casey Cummings is a 62 y.o. male with a history of CP, dx apical airspace dz w/ nodules in RUL (neg for TB)>>outpt pulm eval has been negative so far.   Admitted 05/01-05/03 for STEMI, s/p DES LAD, EF 65-70%  Casey Cummings presents for cardiology follow up.  He and his wife have some concerns about taking nitro. He has had a couple of episodes of chest pain. He has taken nitro a couple of times with relief.   He has DOE, is confused if it is related to his heart or his lungs. Do these problems intersect?  There is a possibility that he will need surgery on his lungs as the nodules may be be secondary to carcinoma.  He wonders what will be needed as far as the Kary Kos is concerned.  He is compliant with it and does not miss any doses.  He wonders if it is okay to take the Lipitor and the Brilinta together in the evening.  He gets migraines with aura once or twice a month, takes Excedrin Migraine for them.  He has not had one in the last several weeks.  His blood pressure normally runs low, he and his wife have for the same problem.  They drink a cup of coffee in the morning to get the blood pressure up.  He does not overindulge in caffeine.  They are trying to eat healthier but are a little frustrated because they were focusing on heart healthy eating before his heart attack.  He is looking forward to attending cardiac rehab.  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  He wonders when he can go back to work.  He has not had prolonged severe chest pain that makes him think he is having another heart attack.  He denies dyspnea on exertion.  He is doing the walking recommended by the inpatient cardiac rehab staff.   Past Medical History:  Diagnosis Date  . Dyslipidemia, goal LDL below 70   . Pulmonary nodules 06/2017  . STEMI involving left anterior descending coronary artery ([place]) 07/07/2017   DES LAD    Past Surgical History:  Procedure Laterality Date  . CORONARY/GRAFT ACUTE MI REVASCULARIZATION N/A 07/08/2017   Procedure: CORONARY/GRAFT ACUTE MI REVASCULARIZATION;  Surgeon: Leonie Man, MD;  Location: [place] [facility];  Service: Cardiovascular;  Laterality: N/A;  . LEFT HEART CATH AND CORONARY ANGIOGRAPHY N/A 07/08/2017   Procedure: LEFT HEART CATH AND CORONARY ANGIOGRAPHY;  Surgeon: Leonie Man, MD;  Location: [place] [facility];  Service: Cardiovascular;  Laterality: N/A;    Current Outpatient Medications  Medication Sig Dispense Refill  . aspirin 81 MG chewable tablet Chew 1 tablet (81 mg total) by mouth daily. 90 tablet 3  . aspirin-acetaminophen-caffeine (EXCEDRIN MIGRAINE) 250-250-65 MG tablet Take 1 tablet by mouth every 6 (six) hours as needed for headache.    Marland Kitchen atorvastatin (  LIPITOR) 80 MG tablet Take 1 tablet (80 mg total) by mouth daily at 6 PM. 90 tablet 3  . Multiple Vitamin (MULTIVITAMIN) tablet Take 1 tablet by mouth daily.    . nitroGLYCERIN (NITROSTAT) 0.4 MG SL tablet Place 1 tablet (0.4 mg total) under the tongue every 5 (five) minutes as needed for chest pain. 25 tablet 3  . ticagrelor (BRILINTA) 90 MG TABS tablet Take 1 tablet (90 mg total) by mouth 2 (two) times daily. 180 tablet 3   No current facility-administered medications for this visit.     Allergies:   Patient has no known allergies.    Social History:  The patient  reports that he has never smoked. He  has never used smokeless tobacco. He reports that he drinks alcohol. He reports that he does not use drugs.   Family History:  The patient's family history includes CAD in his mother.    ROS:  Please see the history of present illness. All other systems are reviewed and negative.    PHYSICAL EXAM: VS:  BP 132/82 (BP Location: Left Arm)   Pulse 81   Ht 5\' 10"  (1.778 m)   Wt 167 lb 3.2 oz (75.8 kg)   BMI 23.99 kg/m  , BMI Body mass index is 23.99 kg/m. GEN: Well nourished, well developed, male in no acute distress  HEENT: normal for age  Neck: no JVD, no carotid bruit, no masses Cardiac: RRR; no murmur, no rubs, or gallops Respiratory:  clear to auscultation bilaterally, normal work of breathing GI: soft, nontender, nondistended, + BS MS: no deformity or atrophy; no edema; distal pulses are 2+ in all 4 extremities   Skin: warm and dry, no rash Neuro:  Strength and sensation are intact Psych: euthymic mood, full affect   EKG:  EKG is ordered today. The ekg ordered today demonstrates sinus rhythm, heart rate 68, no acute ischemic changes and normal intervals, no pathologic Q waves  ECHO: 07/08/2017 - Left ventricle: The cavity size was normal. Wall thickness was   increased in a pattern of mild LVH. Systolic function was   vigorous. The estimated ejection fraction was in the range of 65%   to 70%. Wall motion was normal; there were no regional wall   motion abnormalities. Doppler parameters are consistent with   abnormal left ventricular relaxation (grade 1 diastolic   dysfunction). The E/e&' ratio is between 8-15, suggesting   indeterminate LV filling pressure. - Left atrium: The atrium was normal in size. - Inferior vena cava: The vessel was normal in size. The   respirophasic diameter changes were in the normal range (>= 50%),   consistent with normal central venous pressure. Impressions: - LVEF 65-70%, mild LVH, normal wall motion, grade 1 DD,   indeterminate LV  filling pressure, normal LA size, normal IVC.  CATH 07/08/2017  Culprit lesion: Prox LAD lesion is 90% stenosed.  A drug-eluting stent was successfully placed using a STENT SYNERGY DES 3.5X16. Postdilated in tapered fashion to 4.1-3.9 mm  Post intervention, there is a 0% residual stenosis.  Mid LAD to Dist LAD lesion is 40% stenosed. The downstream LAD is small caliber diffusely diseased.  The left ventricular systolic function is normal. The left ventricular ejection fraction is 55-65% by visual estimate.  LV end diastolic pressure is normal.  Severe single-vessel disease status post PCI with DES stent. Post-Intervention Diagram         Recent Labs: 07/08/2017: Magnesium 2.0; TSH 3.323 07/09/2017: BUN 14; Creatinine, Ser  0.95; Hemoglobin 14.1; Platelets 289; Potassium 3.9; Sodium 138    Lipid Panel    Component Value Date/Time   CHOL 175 07/08/2017 1244   TRIG 126 07/08/2017 1244   HDL 48 07/08/2017 1244   CHOLHDL 3.6 07/08/2017 1244   VLDL 25 07/08/2017 1244   LDLCALC 102 (H) 07/08/2017 1244     Wt Readings from Last 3 Encounters:  07/20/17 167 lb 3.2 oz (75.8 kg)  07/08/17 160 lb 9.6 oz (72.8 kg)     Other studies Reviewed: Additional studies/ records that were reviewed today include: Hospital records and testing.  ASSESSMENT AND PLAN:  1.  STEMI, follow-up visit: He is doing very well from a cardiac standpoint.  Continue aspirin, Brilinta, high-dose statin, sublingual nitro. -  I will add low-dose beta-blocker to see if this is tolerated.  He understands that if he does not tolerate it, he can just stop it. -  Make sure there is a referral into cardiac rehab  2.  Dyslipidemia: I explained that his goal LDL is now less than 70 and he was 102.  - He is tolerating the Lipitor well, recheck complete metabolic profile and lipids in 3 months.  - He can get it done through [place] or through his PCP.  3.  Lung nodules: He has some dyspnea on exertion and a cough because of  this.  He has an appointment with Dr. Lamonte Sakai with pulmonology.  I explained that if any kind of surgical procedures were needed, we will work with the surgeons to determine the best way to manage his DAPT.  Advised that we would be reluctant to stop it for the first month, but did not want to delay surgery if they thought he had lung cancer.  Keep [place] updated.   Current medicines are reviewed at length with the patient today.  The patient has concerns regarding medicines.  Concerns were addressed  The following changes have been made: Add low-dose beta-blocker  Labs/ tests ordered today include:   Orders Placed This Encounter  Procedures  . Lipid panel  . Comprehensive metabolic panel  . AMB referral to cardiac rehabilitation  . EKG 12-Lead     Disposition:   FU with Dr. Percival Spanish  Signed, Rosaria Ferries, PA-C  07/20/2017 10:28 AM    [place] Phone: [phone]; Fax: [phone]  This note was written with the assistance of speech recognition software. Please excuse any transcriptional errors.

## 2017-07-20 ENCOUNTER — Encounter: Payer: Self-pay | Admitting: Physician Assistant

## 2017-07-20 ENCOUNTER — Telehealth: Payer: Self-pay | Admitting: Emergency Medicine

## 2017-07-20 ENCOUNTER — Ambulatory Visit (INDEPENDENT_AMBULATORY_CARE_PROVIDER_SITE_OTHER): Payer: Managed Care, Other (non HMO) | Admitting: Physician Assistant

## 2017-07-20 VITALS — BP 132/82 | HR 81 | Ht 70.0 in | Wt 167.2 lb

## 2017-07-20 DIAGNOSIS — Z955 Presence of coronary angioplasty implant and graft: Secondary | ICD-10-CM

## 2017-07-20 DIAGNOSIS — R911 Solitary pulmonary nodule: Secondary | ICD-10-CM

## 2017-07-20 DIAGNOSIS — E785 Hyperlipidemia, unspecified: Secondary | ICD-10-CM

## 2017-07-20 DIAGNOSIS — I2109 ST elevation (STEMI) myocardial infarction involving other coronary artery of anterior wall: Secondary | ICD-10-CM | POA: Diagnosis not present

## 2017-07-20 MED ORDER — METOPROLOL SUCCINATE ER 25 MG PO TB24: 12.5000 mg | ORAL_TABLET | Freq: Every day | ORAL | 6 refills | Status: DC

## 2017-07-20 NOTE — Patient Instructions (Signed)
Medication instructions   start METOPROLOL SUCCINATE ( TOPROL XL ) 12.5 MG ( 1/2 TABLET OF 25 MG ) TAKE AT BEDTIME.   LABS  - DO NOT EAT OR DRINK THE MORNING OF THE TEST. CMP LIPID    You have been referred to [facility]    DIET -- 60 GRAM FAT , 10 GRAMS OF SATURATED FATS  LOW CHOLESTEROL DIET   Your physician recommends that you schedule a follow-up appointment in [place].     Fat and Cholesterol Restricted Diet Getting too much fat and cholesterol in your diet may cause health problems. Following this diet helps keep your fat and cholesterol at normal levels. This can keep you from getting sick. What types of fat should I choose?  Choose monosaturated and polyunsaturated fats. These are found in foods such as olive oil, canola oil, flaxseeds, walnuts, almonds, and seeds.  Eat more omega-3 fats. Good choices include salmon, mackerel, sardines, tuna, flaxseed oil, and ground flaxseeds.  Limit saturated fats. These are in animal products such as meats, butter, and cream. They can also be in plant products such as palm oil, palm kernel oil, and coconut oil.  Avoid foods with partially hydrogenated oils in them. These contain trans fats. Examples of foods that have trans fats are stick margarine, some tub margarines, cookies, crackers, and other baked goods. What general guidelines do I need to follow?  Check food labels. Look for the words "trans fat" and "saturated fat."  When preparing a meal: ? Fill half of your plate with vegetables and green salads. ? Fill one fourth of your plate with whole grains. Look for the word "whole" as the first word in the ingredient list. ? Fill one fourth of your plate with lean protein foods.  Eat more foods that have fiber, like apples, carrots, beans, peas, and barley.  Eat more home-cooked foods. Eat less at restaurants and buffets.  Limit or  avoid alcohol.  Limit foods high in starch and sugar.  Limit fried foods.  Cook foods without frying them. Baking, boiling, grilling, and broiling are all great options.  Lose weight if you are overweight. Losing even a small amount of weight can help your overall health. It can also help prevent diseases such as diabetes and heart disease. What foods can I eat? Grains Whole grains, such as whole wheat or whole grain breads, crackers, cereals, and pasta. Unsweetened oatmeal, bulgur, barley, quinoa, or brown rice. Corn or whole wheat flour tortillas. Vegetables Fresh or frozen vegetables (raw, steamed, roasted, or grilled). Green salads. Fruits All fresh, canned (in natural juice), or frozen fruits. Meat and Other Protein Products Ground beef (85% or leaner), grass-fed beef, or beef trimmed of fat. Skinless chicken or [place]. Ground chicken or [place]. Pork trimmed of fat. All fish and seafood. Eggs. Dried beans, peas, or lentils. Unsalted nuts or seeds. Unsalted canned or dry beans. Dairy Low-fat dairy products, such as skim or 1% milk, 2% or reduced-fat cheeses, low-fat ricotta or cottage cheese, or plain low-fat yogurt. Fats and Oils Tub margarines without trans fats. Light or reduced-fat mayonnaise and salad dressings. Avocado. Olive, canola, sesame, or safflower oils. Natural peanut or almond butter (choose ones without added sugar and oil). The items listed above may not be a complete list of recommended foods or beverages. Contact your dietitian for more options. What foods are not recommended? Grains White bread. White  pasta. White rice. Cornbread. Bagels, pastries, and croissants. Crackers that contain trans fat. Vegetables White potatoes. Corn. Creamed or fried vegetables. Vegetables in a cheese sauce. Fruits Dried fruits. Canned fruit in light or heavy syrup. Fruit juice. Meat and Other Protein Products Fatty cuts of meat. Ribs, chicken wings, bacon, sausage, bologna, salami,  chitterlings, fatback, hot dogs, bratwurst, and packaged luncheon meats. Liver and organ meats. Dairy Whole or 2% milk, cream, half-and-half, and cream cheese. Whole milk cheeses. Whole-fat or sweetened yogurt. Full-fat cheeses. Nondairy creamers and whipped toppings. Processed cheese, cheese spreads, or cheese curds. Sweets and Desserts Corn syrup, sugars, honey, and molasses. Candy. Jam and jelly. Syrup. Sweetened cereals. Cookies, pies, cakes, donuts, muffins, and ice cream. Fats and Oils Butter, stick margarine, lard, shortening, ghee, or bacon fat. Coconut, palm kernel, or palm oils. Beverages Alcohol. Sweetened drinks (such as sodas, lemonade, and fruit drinks or punches). The items listed above may not be a complete list of foods and beverages to avoid. Contact your dietitian for more information. This information is not intended to replace advice given to you by your health care provider. Make sure you discuss any questions you have with your health care provider. Document Released: 08/25/2011 Document Revised: 10/31/2015 Document Reviewed: 05/25/2013 Elsevier Interactive Patient Education  [employer].

## 2017-07-20 NOTE — Telephone Encounter (Signed)
Received items and will place in RB review folder. Please advise if you would like pt to keep his appt on 5/24.

## 2017-07-26 NOTE — Telephone Encounter (Signed)
Spoke with Dorian Pod, advised message from [place]. She agreed and will cancel the appointment at [facility]. Nothing further is needed.

## 2017-07-26 NOTE — Telephone Encounter (Signed)
Have him cancel the CT chest at [facility]. I will review the films from [place] and [place] today, then we can discuss at his [place] 5/29. If we need to repeat his imaging we will do it in [place].

## 2017-08-04 ENCOUNTER — Encounter: Payer: Self-pay | Admitting: Emergency Medicine

## 2017-08-04 ENCOUNTER — Ambulatory Visit (INDEPENDENT_AMBULATORY_CARE_PROVIDER_SITE_OTHER): Payer: Managed Care, Other (non HMO) | Admitting: Emergency Medicine

## 2017-08-04 VITALS — BP 136/92 | HR 70 | Ht 70.0 in | Wt 166.0 lb

## 2017-08-04 DIAGNOSIS — R0602 Shortness of breath: Secondary | ICD-10-CM

## 2017-08-04 DIAGNOSIS — R9389 Abnormal findings on diagnostic imaging of other specified body structures: Secondary | ICD-10-CM | POA: Diagnosis not present

## 2017-08-04 DIAGNOSIS — R918 Other nonspecific abnormal finding of lung field: Secondary | ICD-10-CM | POA: Diagnosis not present
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
## 2017-08-04 NOTE — Assessment & Plan Note (Signed)
Bilateral apical pleural-parenchymal scarring of unclear etiology.  Could be postinfectious scar, noninfectious inflammatory process with associated scarring, exposure related.  His PPD and QuantiFERON gold were both reassuring.  There may be a slightly nodular component to the right upper lobe scar but nothing that raises concern for possible malignancy.  It is reassuring that his apical abnormalities have not changed significantly compared with his CT scan of the neck that was done on November 2017.  I believe he needs a repeat scan before the end of this year.  We will compare with his priors, look for interval change.  Based on the stability of his infiltrates, his clinical stability, we will decide whether any further work-up is warranted.

## 2017-08-04 NOTE — Patient Instructions (Addendum)
We will plan to repeat your CT chest in November 2019 to compare with your prior.  Based on your scan and your clinical status we will determine whether any further workup needs to be initiated.  Please call us if you have any changes in breathing or respiratory symptoms.  Follow with Dr Lamonte Sakai in November after the Ct scan to review.

## 2017-08-06 LAB — LIPID PANEL
CHOLESTEROL TOTAL: 92 mg/dL — AB (ref 100–199)
Chol/HDL Ratio: 2.2 ratio (ref 0.0–5.0)
HDL: 41 mg/dL (ref >39)
LDL Calculated: 34 mg/dL (ref 0–99)
Triglycerides: 86 mg/dL (ref 0–149)
VLDL CHOLESTEROL CAL: 17 mg/dL (ref 5–40)

## 2017-08-06 LAB — COMPREHENSIVE METABOLIC PANEL
ALK PHOS: 81 IU/L (ref 39–117)
ALT: 30 IU/L (ref 0–44)
AST: 23 IU/L (ref 0–40)
Albumin/Globulin Ratio: 2.1 (ref 1.2–2.2)
Albumin: 4.2 g/dL (ref 3.6–4.8)
BUN / CREAT RATIO: 13 (ref 10–24)
BUN: 13 mg/dL (ref 8–27)
Bilirubin Total: 0.7 mg/dL (ref 0.0–1.2)
CO2: 24 mmol/L (ref 20–29)
CREATININE: 1.02 mg/dL (ref 0.76–1.27)
Calcium: 9.3 mg/dL (ref 8.6–10.2)
Chloride: 105 mmol/L (ref 96–106)
GFR calc Af Amer: 91 mL/min/{1.73_m2} (ref >59)
GFR calc non Af Amer: 79 mL/min/{1.73_m2} (ref >59)
GLOBULIN, TOTAL: 2 g/dL (ref 1.5–4.5)
Glucose: 85 mg/dL (ref 65–99)
Potassium: 4.8 mmol/L (ref 3.5–5.2)
Sodium: 142 mmol/L (ref 134–144)
Total Protein: 6.2 g/dL (ref 6.0–8.5)

## 2017-08-11 ENCOUNTER — Telehealth (HOSPITAL_COMMUNITY): Payer: Self-pay

## 2017-08-11 NOTE — Telephone Encounter (Signed)
Wife of patient called to follow up in regards to Cardiac Rehab - Scheduled orientation on 08/24/2017 at 7:30am. Patient will attend the 2:45pm exc class. Went over insurance with wife and verbalized understanding. Mailed packet.

## 2017-08-14 ENCOUNTER — Other Ambulatory Visit: Payer: Self-pay

## 2017-08-14 ENCOUNTER — Encounter (HOSPITAL_COMMUNITY): Payer: Self-pay | Admitting: Pharmacy Technician

## 2017-08-14 ENCOUNTER — Emergency Department (HOSPITAL_COMMUNITY): Payer: Managed Care, Other (non HMO)

## 2017-08-14 ENCOUNTER — Emergency Department (HOSPITAL_COMMUNITY)
Admission: EM | Admit: 2017-08-14 | Discharge: 2017-08-14 | Disposition: A | Discharge status: Home / Self Care | Payer: Managed Care, Other (non HMO) | Attending: Emergency Medicine | Admitting: Emergency Medicine

## 2017-08-14 DIAGNOSIS — I25118 Atherosclerotic heart disease of native coronary artery with other forms of angina pectoris: Secondary | ICD-10-CM

## 2017-08-14 DIAGNOSIS — Z79899 Other long term (current) drug therapy: Secondary | ICD-10-CM | POA: Diagnosis not present

## 2017-08-14 DIAGNOSIS — Z7982 Long term (current) use of aspirin: Secondary | ICD-10-CM | POA: Insufficient documentation

## 2017-08-14 DIAGNOSIS — R0789 Other chest pain: Secondary | ICD-10-CM | POA: Diagnosis not present

## 2017-08-14 DIAGNOSIS — R079 Chest pain, unspecified: Secondary | ICD-10-CM

## 2017-08-14 HISTORY — DX: Atherosclerotic heart disease of native coronary artery without angina pectoris: I25.10

## 2017-08-14 HISTORY — DX: Other ill-defined heart diseases: I51.89

## 2017-08-14 LAB — CBC
HCT: 41 % (ref 39.0–52.0)
HEMOGLOBIN: 14.3 g/dL (ref 13.0–17.0)
MCH: 31.2 pg (ref 26.0–34.0)
MCHC: 34.9 g/dL (ref 30.0–36.0)
MCV: 89.5 fL (ref 78.0–100.0)
Platelets: 252 10*3/uL (ref 150–400)
RBC: 4.58 MIL/uL (ref 4.22–5.81)
RDW: 12 % (ref 11.5–15.5)
WBC: 5.9 10*3/uL (ref 4.0–10.5)

## 2017-08-14 LAB — BASIC METABOLIC PANEL
ANION GAP: 10 (ref 5–15)
BUN: 17 mg/dL (ref 6–20)
CALCIUM: 9 mg/dL (ref 8.9–10.3)
CO2: 20 mmol/L — AB (ref 22–32)
CREATININE: 1.04 mg/dL (ref 0.61–1.24)
Chloride: 107 mmol/L (ref 101–111)
GFR calc Af Amer: >60 mL/min (ref >60)
GFR calc non Af Amer: >60 mL/min (ref >60)
GLUCOSE: 108 mg/dL — AB (ref 65–99)
Potassium: 3.5 mmol/L (ref 3.5–5.1)
Sodium: 137 mmol/L (ref 135–145)

## 2017-08-14 LAB — CBG MONITORING, ED: GLUCOSE-CAPILLARY: 124 mg/dL — AB (ref 65–99)

## 2017-08-14 LAB — TROPONIN I: Troponin I: <0.03 ng/mL (ref <0.03)

## 2017-08-14 LAB — I-STAT TROPONIN, ED: Troponin i, poc: 0 ng/mL (ref 0.00–0.08)

## 2017-08-14 MED ORDER — NITROGLYCERIN 2 % TD OINT
1.0000 [in_us] | TOPICAL_OINTMENT | Freq: Once | TRANSDERMAL | Status: AC
  Administered 2017-08-14: 1 [in_us] via TOPICAL
  Filled 2017-08-14: qty 1
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
## 2017-08-14 NOTE — ED Provider Notes (Signed)
Per cardiology evaluation, patient appropriate for discharge with close outpatient cardiology follow-up. On exam he is awake and alert, ready for discharge, questions answered, patient discharged with family members.   Carmin Muskrat, MD 08/14/17 [phone]
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
## 2017-08-14 NOTE — Discharge Instructions (Addendum)
As discussed, your evaluation today has been largely reassuring.  But, it is important that you monitor your condition carefully, and do not hesitate to return to the ED if you develop new, or concerning changes in your condition. ? ?Otherwise, please follow-up with your physician for appropriate ongoing care. ? ?

## 2017-08-16 ENCOUNTER — Ambulatory Visit
Admission: RE | Admit: 2017-08-16 | Discharge: 2017-08-16 | Disposition: A | Discharge status: Home / Self Care | Payer: Self-pay | Source: Ambulatory Visit | Attending: Emergency Medicine | Admitting: Emergency Medicine

## 2017-08-16 ENCOUNTER — Telehealth: Payer: Self-pay | Admitting: Physician Assistant

## 2017-08-16 ENCOUNTER — Other Ambulatory Visit: Payer: Self-pay | Admitting: Emergency Medicine

## 2017-08-16 DIAGNOSIS — R9389 Abnormal findings on diagnostic imaging of other specified body structures: Secondary | ICD-10-CM

## 2017-08-16 DIAGNOSIS — R911 Solitary pulmonary nodule: Secondary | ICD-10-CM
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
## 2017-08-16 NOTE — Telephone Encounter (Signed)
New Message   Pt states he was just in the er on 6/8 and they told him to consult with his cardiologist but he wants to speak to a nurse to see if he needs an appt. Please call

## 2017-08-16 NOTE — Telephone Encounter (Signed)
Patient notified

## 2017-08-16 NOTE — Telephone Encounter (Signed)
Notes reviewed.  OK to start rehab.

## 2017-08-18 ENCOUNTER — Telehealth (HOSPITAL_COMMUNITY): Payer: Self-pay | Admitting: Pharmacist
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
## 2017-08-24 ENCOUNTER — Encounter (HOSPITAL_COMMUNITY): Payer: Self-pay

## 2017-08-24 ENCOUNTER — Encounter (HOSPITAL_COMMUNITY)
Admission: RE | Admit: 2017-08-24 | Discharge: 2017-08-24 | Disposition: A | Discharge status: Home / Self Care | Payer: Managed Care, Other (non HMO) | Source: Ambulatory Visit | Attending: Cardiology | Admitting: Cardiology

## 2017-08-24 VITALS — BP 124/80 | HR 66 | Ht 70.0 in | Wt 166.2 lb

## 2017-08-24 DIAGNOSIS — Z955 Presence of coronary angioplasty implant and graft: Secondary | ICD-10-CM | POA: Diagnosis present

## 2017-08-24 DIAGNOSIS — Z79899 Other long term (current) drug therapy: Secondary | ICD-10-CM | POA: Diagnosis not present

## 2017-08-24 DIAGNOSIS — Z7982 Long term (current) use of aspirin: Secondary | ICD-10-CM | POA: Insufficient documentation

## 2017-08-24 DIAGNOSIS — I213 ST elevation (STEMI) myocardial infarction of unspecified site: Secondary | ICD-10-CM

## 2017-08-24 DIAGNOSIS — E785 Hyperlipidemia, unspecified: Secondary | ICD-10-CM | POA: Diagnosis not present

## 2017-08-24 DIAGNOSIS — I2510 Atherosclerotic heart disease of native coronary artery without angina pectoris: Secondary | ICD-10-CM | POA: Diagnosis not present

## 2017-08-24 NOTE — Progress Notes (Signed)
Cardiac Individual Treatment Plan  Patient Details  Name: Casey Cummings MRN: [id number] Date of Birth: May 30, 1955 Referring Provider:   Flowsheet Row CARDIAC REHAB PHASE II ORIENTATION from 08/24/2017 in [place]  Referring Provider  Minus Breeding, MD.      Initial Encounter Date:  Flowsheet Row CARDIAC REHAB PHASE II ORIENTATION from 08/24/2017 in [place]  Date  08/24/17  Referring Provider  Minus Breeding, MD.      Visit Diagnosis: ST elevation myocardial infarction (STEMI), unspecified artery ([place])  Stented coronary artery  Patient's Home Medications on Admission:  Current Outpatient Medications:  .  aspirin 81 MG chewable tablet, Chew 1 tablet (81 mg total) by mouth daily., Disp: 90 tablet, Rfl: 3 .  atorvastatin (LIPITOR) 80 MG tablet, Take 1 tablet (80 mg total) by mouth daily at 6 PM., Disp: 90 tablet, Rfl: 3 .  metoprolol succinate (TOPROL XL) 25 MG 24 hr tablet, Take 0.5 tablets (12.5 mg total) by mouth daily., Disp: 15 tablet, Rfl: 6 .  Multiple Vitamin (MULTIVITAMIN) tablet, Take 1 tablet by mouth daily., Disp: , Rfl:  .  nitroGLYCERIN (NITROSTAT) 0.4 MG SL tablet, Place 1 tablet (0.4 mg total) under the tongue every 5 (five) minutes as needed for chest pain., Disp: 25 tablet, Rfl: 3 .  ticagrelor (BRILINTA) 90 MG TABS tablet, Take 1 tablet (90 mg total) by mouth 2 (two) times daily., Disp: 180 tablet, Rfl: 3  Past Medical History: Past Medical History:  Diagnosis Date  . CAD (coronary artery disease)    a. 07/2017 s/p Ant STEMI/PCI: initially admitted w/ chest pain->Cardiac CTA: Ca2+ 0, >70%LAD stenosis->pt later dev Ant STEMI->Cath: LM nl, LAD 90p (3.5x16 Synergy DES), 67m, LCX nl, RCA nl.  . Diastolic dysfunction    a. 07/2017 Echo: EF 65-70%, mild LVH, Gr1 DD.  Marland Kitchen Dyslipidemia, goal LDL below 70   . Pulmonary nodules    a. 06/2017 Chest CT [facility]): biapical airspace dzs w/ more nodular focus  in RUL w/ some associated bronchiectasis and prominent pleural-parenchymal scarring (seen by Pulm - similar findings on CT neck in 01/2016).    Tobacco Use: Social History   Tobacco Use  Smoking Status Never Smoker  Smokeless Tobacco Never Used    Labs: Recent Review Scientist, physiological    Labs for ITP Cardiac and Pulmonary Rehab Latest Ref Rng & Units 07/08/2017 08/06/2017   Cholestrol 100 - 199 mg/dL 175 92(L)   LDLCALC 0 - 99 mg/dL 102(H) 34   HDL >39 mg/dL 48 41   Trlycerides 0 - 149 mg/dL 126 86   Hemoglobin A1c 4.8 - 5.6 % 5.6 -      Capillary Blood Glucose: Lab Results  Component Value Date   GLUCAP 124 (H) 08/14/2017     Exercise Target Goals: Date: 08/24/17  Exercise Program Goal: Individual exercise prescription set using results from initial 6 min walk test and THRR while considering  patient's activity barriers and safety.   Exercise Prescription Goal: Initial exercise prescription builds to 30-45 minutes a day of aerobic activity, 2-3 days per week.  Home exercise guidelines will be given to patient during program as part of exercise prescription that the participant will acknowledge.  Activity Barriers & Risk Stratification: Activity Barriers & Cardiac Risk Stratification - 08/24/17 0850    Activity Barriers & Cardiac Risk Stratification          Activity Barriers  Other (comment)    Comments  Vertigo  Cardiac Risk Stratification  High           6 Minute Walk: 6 Minute Walk    6 Minute Walk    Row Name 08/24/17 0848   Phase  Initial   Distance  1600 feet   Walk Time  6 minutes   # of Rest Breaks  0   MPH  3.03   METS  4.09   RPE  13   VO2 Peak  14.32   Symptoms  No   Resting HR  66 bpm   Resting BP  124/80   Resting Oxygen Saturation   97 %   Exercise Oxygen Saturation  during 6 min walk  99 %   Max Ex. HR  96 bpm   Max Ex. BP  122/78   2 Minute Post BP  108/68          Oxygen Initial Assessment:   Oxygen Re-Evaluation:   Oxygen  Discharge (Final Oxygen Re-Evaluation):   Initial Exercise Prescription: Initial Exercise Prescription - 08/24/17 1000    Date of Initial Exercise RX and Referring Provider          Date  08/24/17    Referring Provider  Minus Breeding, MD.        Bike          Level  1.2    Minutes  10    METs  4.02        NuStep          Level  4    SPM  85    Minutes  10    METs  3.1        Track          Laps  12    Minutes  10    METs  3.09        Prescription Details          Frequency (times per week)  3    Duration  Progress to 30 minutes of continuous aerobic without signs/symptoms of physical distress        Intensity          THRR 40-80% of Max Heartrate  64-127    Ratings of Perceived Exertion  11-13    Perceived Dyspnea  0-4        Progression          Progression  Continue to progress workloads to maintain intensity without signs/symptoms of physical distress.        Resistance Training          Training Prescription  Yes    Weight  4lbs    Reps  10-15           Perform Capillary Blood Glucose checks as needed.  Exercise Prescription Changes:   Exercise Comments:   Exercise Goals and Review: Exercise Goals    Exercise Goals    Row Name 08/24/17 0848   Increase Physical Activity  Yes   Intervention  Provide advice, education, support and counseling about physical activity/exercise needs.;Develop an individualized exercise prescription for aerobic and resistive training based on initial evaluation findings, risk stratification, comorbidities and participant's personal goals.   Expected Outcomes  Short Term: Attend rehab on a regular basis to increase amount of physical activity.;Long Term: Exercising regularly at least 3-5 days a week.;Long Term: Add in home exercise to make exercise part of routine and to increase amount of physical activity.   Increase Strength and Stamina  Yes  Intervention  Provide advice, education, support and counseling  about physical activity/exercise needs.;Develop an individualized exercise prescription for aerobic and resistive training based on initial evaluation findings, risk stratification, comorbidities and participant's personal goals.   Expected Outcomes  Short Term: Increase workloads from initial exercise prescription for resistance, speed, and METs.;Short Term: Perform resistance training exercises routinely during rehab and add in resistance training at home;Long Term: Improve cardiorespiratory fitness, muscular endurance and strength as measured by increased METs and functional capacity (6MWT)   Able to understand and use rate of perceived exertion (RPE) scale  Yes   Intervention  Provide education and explanation on how to use RPE scale   Expected Outcomes  Short Term: Able to use RPE daily in rehab to express subjective intensity level;Long Term:  Able to use RPE to guide intensity level when exercising independently   Knowledge and understanding of Target Heart Rate Range (THRR)  Yes   Intervention  Provide education and explanation of THRR including how the numbers were predicted and where they are located for reference   Expected Outcomes  Short Term: Able to state/look up THRR;Long Term: Able to use THRR to govern intensity when exercising independently;Short Term: Able to use daily as guideline for intensity in rehab   Able to check pulse independently  Yes   Intervention  Provide education and demonstration on how to check pulse in carotid and radial arteries.;Review the importance of being able to check your own pulse for safety during independent exercise   Expected Outcomes  Short Term: Able to explain why pulse checking is important during independent exercise;Long Term: Able to check pulse independently and accurately   Understanding of Exercise Prescription  Yes   Intervention  Provide education, explanation, and written materials on patient's individual exercise prescription   Expected  Outcomes  Short Term: Able to explain program exercise prescription;Long Term: Able to explain home exercise prescription to exercise independently          Exercise Goals Re-Evaluation :    Discharge Exercise Prescription (Final Exercise Prescription Changes):   Nutrition:  Target Goals: Understanding of nutrition guidelines, daily intake of sodium 1500mg , cholesterol 200mg , calories 30% from fat and 7% or less from saturated fats, daily to have 5 or more servings of fruits and vegetables.  Biometrics: Pre Biometrics - 08/24/17 1100    Pre Biometrics          Height  5\' 10"  (1.778 m)    Weight  166 lb 3.6 oz (75.4 kg)    Waist Circumference  34.75 inches    Hip Circumference  38.5 inches    Waist to Hip Ratio  0.9 %    BMI (Calculated)  23.85    Triceps Skinfold  13 mm    % Body Fat  22.7 %    Grip Strength  42.5 kg    Flexibility  9.5 in    Single Leg Stand  11.6 seconds            Nutrition Therapy Plan and Nutrition Goals:   Nutrition Assessments:   Nutrition Goals Re-Evaluation:   Nutrition Goals Re-Evaluation:   Nutrition Goals Discharge (Final Nutrition Goals Re-Evaluation):   Psychosocial: Target Goals: Acknowledge presence or absence of significant depression and/or stress, maximize coping skills, provide positive support system. Participant is able to verbalize types and ability to use techniques and skills needed for reducing stress and depression.  Initial Review & Psychosocial Screening: Initial Psych Review & Screening - 08/24/17 1053  Initial Review          Current issues with  None Identified        Family Dynamics          Good Support System?  Yes wife, children, friends         Barriers          Psychosocial barriers to participate in program  There are no identifiable barriers or psychosocial needs.        Screening Interventions          Interventions  Encouraged to exercise           Quality of Life  Scores: Quality of Life - 08/24/17 1054    Quality of Life Scores          Health/Function Pre  20.7 %    Socioeconomic Pre  26.56 %    Psych/Spiritual Pre  24.79 %    Family Pre  28.3 %    GLOBAL Pre  23.96 %          Scores of 19 and below usually indicate a poorer quality of life in these areas.  A difference of  2-3 points is a clinically meaningful difference.  A difference of 2-3 points in the total score of the Quality of Life Index has been associated with significant improvement in overall quality of life, self-image, physical symptoms, and general health in studies assessing change in quality of life.  PHQ-9: Recent Review Flowsheet Data    There is no flowsheet data to display.     Interpretation of Total Score  Total Score Depression Severity:  1-4 = Minimal depression, 5-9 = Mild depression, 10-14 = Moderate depression, 15-19 = Moderately severe depression, 20-27 = Severe depression   Psychosocial Evaluation and Intervention:   Psychosocial Re-Evaluation:   Psychosocial Discharge (Final Psychosocial Re-Evaluation):   Vocational Rehabilitation: Provide vocational rehab assistance to qualifying candidates.   Vocational Rehab Evaluation & Intervention: Vocational Rehab - 08/24/17 1053    Initial Vocational Rehab Evaluation & Intervention          Assessment shows need for Vocational Rehabilitation  No           Education: Education Goals: Education classes will be provided on a weekly basis, covering required topics. Participant will state understanding/return demonstration of topics presented.  Learning Barriers/Preferences: Learning Barriers/Preferences - 08/23/17 1228    Learning Barriers/Preferences          Learning Preferences  Written Material;Verbal Instruction;Skilled Demonstration           Education Topics: Count Your Pulse:  -Group instruction provided by verbal instruction, demonstration, patient participation and written materials  to support subject.  Instructors address importance of being able to find your pulse and how to count your pulse when at home without a heart monitor.  Patients get hands on experience counting their pulse with staff help and individually.   Heart Attack, Angina, and Risk Factor Modification:  -Group instruction provided by verbal instruction, video, and written materials to support subject.  Instructors address signs and symptoms of angina and heart attacks.    Also discuss risk factors for heart disease and how to make changes to improve heart health risk factors.   Functional Fitness:  -Group instruction provided by verbal instruction, demonstration, patient participation, and written materials to support subject.  Instructors address safety measures for doing things around the house.  Discuss how to get up and down off the floor, how to pick things  up properly, how to safely get out of a chair without assistance, and balance training.   Meditation and Mindfulness:  -Group instruction provided by verbal instruction, patient participation, and written materials to support subject.  Instructor addresses importance of mindfulness and meditation practice to help reduce stress and improve awareness.  Instructor also leads participants through a meditation exercise.    Stretching for Flexibility and Mobility:  -Group instruction provided by verbal instruction, patient participation, and written materials to support subject.  Instructors lead participants through series of stretches that are designed to increase flexibility thus improving mobility.  These stretches are additional exercise for major muscle groups that are typically performed during regular warm up and cool down.   Hands Only CPR:  -Group verbal, video, and participation provides a basic overview of AHA guidelines for community CPR. Role-play of emergencies allow participants the opportunity to practice calling for help and chest  compression technique with discussion of AED use.   Hypertension: -Group verbal and written instruction that provides a basic overview of hypertension including the most recent diagnostic guidelines, risk factor reduction with self-care instructions and medication management.    Nutrition I class: Heart Healthy Eating:  -Group instruction provided by PowerPoint slides, verbal discussion, and written materials to support subject matter. The instructor gives an explanation and review of the Therapeutic Lifestyle Changes diet recommendations, which includes a discussion on lipid goals, dietary fat, sodium, fiber, plant stanol/sterol esters, sugar, and the components of a well-balanced, healthy diet.   Nutrition II class: Lifestyle Skills:  -Group instruction provided by PowerPoint slides, verbal discussion, and written materials to support subject matter. The instructor gives an explanation and review of label reading, grocery shopping for heart health, heart healthy recipe modifications, and ways to make healthier choices when eating out.   Diabetes Question & Answer:  -Group instruction provided by PowerPoint slides, verbal discussion, and written materials to support subject matter. The instructor gives an explanation and review of diabetes co-morbidities, pre- and post-prandial blood glucose goals, pre-exercise blood glucose goals, signs, symptoms, and treatment of hypoglycemia and hyperglycemia, and foot care basics.   Diabetes Blitz:  -Group instruction provided by PowerPoint slides, verbal discussion, and written materials to support subject matter. The instructor gives an explanation and review of the physiology behind type 1 and type 2 diabetes, diabetes medications and rational behind using different medications, pre- and post-prandial blood glucose recommendations and Hemoglobin A1c goals, diabetes diet, and exercise including blood glucose guidelines for exercising safely.    Portion  Distortion:  -Group instruction provided by PowerPoint slides, verbal discussion, written materials, and food models to support subject matter. The instructor gives an explanation of serving size versus portion size, changes in portions sizes over the last 20 years, and what consists of a serving from each food group.   Stress Management:  -Group instruction provided by verbal instruction, video, and written materials to support subject matter.  Instructors review role of stress in heart disease and how to cope with stress positively.     Exercising on Your Own:  -Group instruction provided by verbal instruction, power point, and written materials to support subject.  Instructors discuss benefits of exercise, components of exercise, frequency and intensity of exercise, and end points for exercise.  Also discuss use of nitroglycerin and activating EMS.  Review options of places to exercise outside of rehab.  Review guidelines for sex with heart disease.   Cardiac Drugs I:  -Group instruction provided by verbal instruction and written materials  to support subject.  Instructor reviews cardiac drug classes: antiplatelets, anticoagulants, beta blockers, and statins.  Instructor discusses reasons, side effects, and lifestyle considerations for each drug class.   Cardiac Drugs II:  -Group instruction provided by verbal instruction and written materials to support subject.  Instructor reviews cardiac drug classes: angiotensin converting enzyme inhibitors (ACE-I), angiotensin II receptor blockers (ARBs), nitrates, and calcium channel blockers.  Instructor discusses reasons, side effects, and lifestyle considerations for each drug class.   Anatomy and Physiology of the Circulatory System:  Group verbal and written instruction and models provide basic cardiac anatomy and physiology, with the coronary electrical and arterial systems. Review of: AMI, Angina, Valve disease, Heart Failure, Peripheral Artery  Disease, Cardiac Arrhythmia, Pacemakers, and the ICD.   Other Education:  -Group or individual verbal, written, or video instructions that support the educational goals of the cardiac rehab program.   Holiday Eating Survival Tips:  -Group instruction provided by PowerPoint slides, verbal discussion, and written materials to support subject matter. The instructor gives patients tips, tricks, and techniques to help them not only survive but enjoy the holidays despite the onslaught of food that accompanies the holidays.   Knowledge Questionnaire Score: Knowledge Questionnaire Score - 08/24/17 1036    Knowledge Questionnaire Score          Pre Score  24/24           Core Components/Risk Factors/Patient Goals at Admission: Personal Goals and Risk Factors at Admission - 08/24/17 1049    Core Components/Risk Factors/Patient Goals on Admission          Lipids  Yes    Intervention  Provide education and support for participant on nutrition & aerobic/resistive exercise along with prescribed medications to achieve LDL 70mg , HDL >40mg .    Expected Outcomes  Short Term: Participant states understanding of desired cholesterol values and is compliant with medications prescribed. Participant is following exercise prescription and nutrition guidelines.;Long Term: Cholesterol controlled with medications as prescribed, with individualized exercise RX and with personalized nutrition plan. Value goals: LDL < 70mg , HDL > 40 mg.           Core Components/Risk Factors/Patient Goals Review:    Core Components/Risk Factors/Patient Goals at Discharge (Final Review):    ITP Comments: ITP Comments    Row Name 08/23/17 1227   ITP Comments  Dr. Fransico Him, Medical Director       Comments: Patient attended orientation from [phone] to review rules and guidelines for program. Completed 6 minute walk test, Intitial ITP, and exercise prescription.  VSS. Telemetry-sinus rhythm,  Asymptomatic.

## 2017-08-24 NOTE — Progress Notes (Signed)
Rodney Yera 62 y.o. male DOB: 1955/05/28 MRN: [id number]      Nutrition Note  1. ST elevation myocardial infarction (STEMI), unspecified artery ([place])   2. Stented coronary artery    Past Medical History:  Diagnosis Date  . CAD (coronary artery disease)    a. 07/2017 s/p Ant STEMI/PCI: initially admitted w/ chest pain->Cardiac CTA: Ca2+ 0, >70%LAD stenosis->pt later dev Ant STEMI->Cath: LM nl, LAD 90p (3.5x16 Synergy DES), 30m, LCX nl, RCA nl.  . Diastolic dysfunction    a. 07/2017 Echo: EF 65-70%, mild LVH, Gr1 DD.  Marland Kitchen Dyslipidemia, goal LDL below 70   . Pulmonary nodules    a. 06/2017 Chest CT [facility]): biapical airspace dzs w/ more nodular focus in RUL w/ some associated bronchiectasis and prominent pleural-parenchymal scarring (seen by Pulm - similar findings on CT neck in 01/2016).   Meds reviewed. Toprol, lipitor noted  HT: Ht Readings from Last 1 Encounters:  08/24/17 5\' 10"  (1.778 m)    WT: Wt Readings from Last 5 Encounters:  08/24/17 166 lb 3.6 oz (75.4 kg)  08/14/17 160 lb (72.6 kg)  08/04/17 166 lb (75.3 kg)  07/20/17 167 lb 3.2 oz (75.8 kg)  07/08/17 160 lb 9.6 oz (72.8 kg)     Body mass index is 23.85 kg/m.   Current tobacco use? No   Labs:  Lipid Panel     Component Value Date/Time   CHOL 92 (L) 08/06/2017 0808   TRIG 86 08/06/2017 0808   HDL 41 08/06/2017 0808   CHOLHDL 2.2 08/06/2017 0808   CHOLHDL 3.6 07/08/2017 1244   VLDL 25 07/08/2017 1244   LDLCALC 34 08/06/2017 0808    Lab Results  Component Value Date   HGBA1C 5.6 07/08/2017   CBG (last 3)  No results for input(s): GLUCAP in the last 72 hours.  Nutrition Note Spoke with pt. Nutrition plan and goals reviewed with pt. Pt is following Step 1 of the Therapeutic Lifestyle Changes diet. Pt wants to maintain weight. Pt expressed understanding of the information reviewed. Pt aware of nutrition education classes offered and he plans on attending nutrition classes.  Nutrition  Diagnosis ? Food-and nutrition-related knowledge deficit related to lack of exposure to information as related to diagnosis of: ? CVD   Nutrition Intervention ? Pt's individual nutrition plan and goals reviewed with pt.  Nutrition Goal(s):  ? Pt to identify food sources of saturated fat, trans fat, and sodium   Plan:  Pt to attend nutrition classes ? Nutrition I ? Nutrition II  Will provide client-centered nutrition education as part of interdisciplinary care.   Monitor and evaluate progress toward nutrition goal with team.  Laurina Bustle, MS, RD, LDN 08/24/2017 11:55 AM

## 2017-08-30 ENCOUNTER — Encounter (HOSPITAL_COMMUNITY)
Admission: RE | Admit: 2017-08-30 | Discharge: 2017-08-30 | Disposition: A | Discharge status: Home / Self Care | Payer: Managed Care, Other (non HMO) | Source: Ambulatory Visit | Attending: Cardiology | Admitting: Cardiology

## 2017-08-30 ENCOUNTER — Encounter (HOSPITAL_COMMUNITY): Payer: Self-pay

## 2017-08-30 DIAGNOSIS — Z955 Presence of coronary angioplasty implant and graft: Secondary | ICD-10-CM

## 2017-08-30 DIAGNOSIS — I213 ST elevation (STEMI) myocardial infarction of unspecified site: Secondary | ICD-10-CM

## 2017-08-30 NOTE — Progress Notes (Signed)
Daily Session Note  Patient Details  Name: Casey Cummings MRN: [id number] Date of Birth: May 06, 1955 Referring Provider:   Flowsheet Row CARDIAC REHAB PHASE II ORIENTATION from 08/24/2017 in [place]  Referring Provider  Minus Breeding, MD.      Encounter Date: 08/30/2017  Check In: Session Check In - 08/30/17 1536    Check-In          Location  [facility]    Staff Present  Andi Hence, RN, Deland Pretty, MS, ACSM CEP, Exercise Physiologist;Tara Karle Starch, RN BSN;Molly DiVincenzo, MS, ACSM RCEP, Exercise Physiologist    Supervising physician immediately available to respond to emergencies  [facility] immediately available    Physician(s)  Dr. Eliseo Squires    Medication changes reported      No    Fall or balance concerns reported     No    Tobacco Cessation  No Change    Warm-up and Cool-down  Performed as group-led instruction    Resistance Training Performed  Yes    VAD Patient?  No        Pain Assessment          Currently in Pain?  No/denies           Capillary Blood Glucose: No results found for this or any previous visit (from the past 24 hour(s)).    Social History   Tobacco Use  Smoking Status Never Smoker  Smokeless Tobacco Never Used    Goals Met:  Exercise tolerated well  Goals Unmet:  Not Applicable  Comments: Pt started cardiac rehab today.  Pt tolerated light exercise without difficulty. VSS, telemetry-sinus rhythm,  asymptomatic.  Medication list reconciled. Pt denies barriers to medicaiton compliance.  PSYCHOSOCIAL ASSESSMENT:  PHQ-0 Pt exhibits positive coping skills, hopeful outlook with supportive family. No psychosocial needs identified at this time, no psychosocial interventions necessary.    Pt enjoys fishing.   Pt oriented to exercise equipment and routine.    Understanding verbalized.   Dr. Fransico Him is Medical Director for Cardiac Rehab at [facility].

## 2017-09-01 ENCOUNTER — Encounter (HOSPITAL_COMMUNITY)
Admission: RE | Admit: 2017-09-01 | Discharge: 2017-09-01 | Disposition: A | Discharge status: Home / Self Care | Payer: Managed Care, Other (non HMO) | Source: Ambulatory Visit | Attending: Cardiology | Admitting: Cardiology

## 2017-09-01 DIAGNOSIS — Z955 Presence of coronary angioplasty implant and graft: Secondary | ICD-10-CM

## 2017-09-01 DIAGNOSIS — I213 ST elevation (STEMI) myocardial infarction of unspecified site: Secondary | ICD-10-CM

## 2017-09-03 ENCOUNTER — Encounter (HOSPITAL_COMMUNITY)
Admission: RE | Admit: 2017-09-03 | Discharge: 2017-09-03 | Disposition: A | Discharge status: Home / Self Care | Payer: Managed Care, Other (non HMO) | Source: Ambulatory Visit | Attending: Cardiology | Admitting: Cardiology

## 2017-09-03 DIAGNOSIS — I213 ST elevation (STEMI) myocardial infarction of unspecified site: Secondary | ICD-10-CM | POA: Diagnosis not present

## 2017-09-03 DIAGNOSIS — Z955 Presence of coronary angioplasty implant and graft: Secondary | ICD-10-CM

## 2017-09-03 NOTE — Progress Notes (Signed)
Patient exceeded his target heart rate by exceeding his speed of 1.8 mile/hr on the airdyne. Patient's heart rate was noted at 147. Patient told to slow down and was taken off the bike. Blood pressure 128/80. Patient denied feeling lightheaded but reported feeling like her over did it. Oxygen saturation 97. Rohen's heart rate went back down to around 100 sinus rhythm. After resting for about 5 minutes Shanon Brow felt better. Vital signs remained stable for the duration of exercise. Melbert says he did not know that he was exceeding his target heart rate but will do better next time.Will continue to monitor the patient throughout  the program.

## 2017-09-06 ENCOUNTER — Encounter (HOSPITAL_COMMUNITY)
Admission: RE | Admit: 2017-09-06 | Discharge: 2017-09-06 | Disposition: A | Discharge status: Home / Self Care | Payer: Managed Care, Other (non HMO) | Source: Ambulatory Visit | Attending: Cardiology | Admitting: Cardiology

## 2017-09-06 VITALS — Wt 164.7 lb

## 2017-09-06 DIAGNOSIS — I2510 Atherosclerotic heart disease of native coronary artery without angina pectoris: Secondary | ICD-10-CM | POA: Diagnosis not present

## 2017-09-06 DIAGNOSIS — Z955 Presence of coronary angioplasty implant and graft: Secondary | ICD-10-CM | POA: Diagnosis present

## 2017-09-06 DIAGNOSIS — E785 Hyperlipidemia, unspecified: Secondary | ICD-10-CM | POA: Insufficient documentation

## 2017-09-06 DIAGNOSIS — Z79899 Other long term (current) drug therapy: Secondary | ICD-10-CM | POA: Insufficient documentation

## 2017-09-06 DIAGNOSIS — Z7982 Long term (current) use of aspirin: Secondary | ICD-10-CM | POA: Insufficient documentation

## 2017-09-06 DIAGNOSIS — I213 ST elevation (STEMI) myocardial infarction of unspecified site: Secondary | ICD-10-CM

## 2017-09-06 NOTE — Progress Notes (Signed)
Kerney told me that he has a lot of stress on his job. Reviewed stress management techniques and set up an appointment for Shanon Brow to meet with [facility] hospital chaplain on Monday July 8 th at [place], [facility] 09/06/2017 4:43 PM.

## 2017-09-06 NOTE — Progress Notes (Signed)
Casey Cummings 62 y.o. male Nutrition Note Spoke with pt. Nutrition Plan and Nutrition Survey goals reviewed with pt. Pt is following a Heart Healthy diet. Pt and his wife both cook, and focus on lean protein, lean cooking styles, complex carbohydrates, and ealthy fats. Discussed how to balance complex carbohydrates across the day with patient. Pt shared that he brings a snack of almonds (1/4c) with him when he leaves the house, helps him stay on track. Pt shared cooking at home also helps to ensure meals are heart healthy. Discussed how to make chia seed pudding with pt, and discussed alternate healthy fats to try. Per discussion, pt does not use canned/convenience foods often. Pt rarely adds salt to food. Pt eats out infrequently. Pt expressed understanding of the information reviewed. Pt aware of nutrition education classes offered.  Lab Results  Component Value Date   HGBA1C 5.6 07/08/2017    Wt Readings from Last 3 Encounters:  09/06/17 164 lb 10.9 oz (74.7 kg)  08/24/17 166 lb 3.6 oz (75.4 kg)  08/14/17 160 lb (72.6 kg)    Nutrition Diagnosis ? Food-and nutrition-related knowledge deficit related to lack of exposure to information as related to diagnosis of: ? CVD    Nutrition Intervention ? Pt's individual nutrition plan reviewed with pt. ? Benefits of Heart Healthy diet discussed when Medficts reviewed.    Goal(s)  ? Pt to identify and limit food sources of saturated fat, trans fat, and sodium  Plan:  Pt to attend nutrition classes ? Nutrition I ? Nutrition II ? Portion Distortion  Will provide client-centered nutrition education as part of interdisciplinary care.   Monitor and evaluate progress toward nutrition goal with team.   Laurina Bustle, MS, RD, LDN 09/06/2017 3:42 PM

## 2017-09-07 NOTE — Progress Notes (Signed)
Reviewed home exercise guidelines with patient including endpoints, temperature precautions, target heart rate and rate of perceived exertion. Pt is walking as his mode of home exercise. Pt voices understanding of instructions given. [place] Festa M Tajanay Hurley, MS, ACSM CEP  

## 2017-09-08 ENCOUNTER — Encounter (HOSPITAL_COMMUNITY)
Admission: RE | Admit: 2017-09-08 | Discharge: 2017-09-08 | Disposition: A | Discharge status: Home / Self Care | Payer: Managed Care, Other (non HMO) | Source: Ambulatory Visit | Attending: Cardiology | Admitting: Cardiology

## 2017-09-08 DIAGNOSIS — I213 ST elevation (STEMI) myocardial infarction of unspecified site: Secondary | ICD-10-CM | POA: Diagnosis not present

## 2017-09-08 DIAGNOSIS — Z955 Presence of coronary angioplasty implant and graft: Secondary | ICD-10-CM

## 2017-09-10 ENCOUNTER — Encounter (HOSPITAL_COMMUNITY)
Admission: RE | Admit: 2017-09-10 | Discharge: 2017-09-10 | Disposition: A | Discharge status: Home / Self Care | Payer: Managed Care, Other (non HMO) | Source: Ambulatory Visit | Attending: Cardiology | Admitting: Cardiology

## 2017-09-10 DIAGNOSIS — I213 ST elevation (STEMI) myocardial infarction of unspecified site: Secondary | ICD-10-CM

## 2017-09-10 DIAGNOSIS — Z955 Presence of coronary angioplasty implant and graft: Secondary | ICD-10-CM

## 2017-09-13 ENCOUNTER — Telehealth: Payer: Self-pay | Admitting: *Deleted

## 2017-09-13 ENCOUNTER — Encounter (HOSPITAL_COMMUNITY)
Admission: RE | Admit: 2017-09-13 | Discharge: 2017-09-13 | Disposition: A | Discharge status: Home / Self Care | Payer: Managed Care, Other (non HMO) | Source: Ambulatory Visit | Attending: Cardiology | Admitting: Cardiology

## 2017-09-13 DIAGNOSIS — I213 ST elevation (STEMI) myocardial infarction of unspecified site: Secondary | ICD-10-CM

## 2017-09-13 DIAGNOSIS — Z955 Presence of coronary angioplasty implant and graft: Secondary | ICD-10-CM

## 2017-09-13 NOTE — Telephone Encounter (Signed)
Left message for patient, enteric coated aspirin in recommended.

## 2017-09-13 NOTE — Telephone Encounter (Signed)
Patients wife left a msg on the refill vm wanting to know if patient has to take childrens chewable asa 81 mg or can he take the 81 mg enteric coated? She stated that they think that they were told that he should take the chewable but cannot recall. Patient can be reached at [phone] or wife at [phone]. Thanks, MI

## 2017-09-15 ENCOUNTER — Encounter (HOSPITAL_COMMUNITY)
Admission: RE | Admit: 2017-09-15 | Discharge: 2017-09-15 | Disposition: A | Discharge status: Home / Self Care | Payer: Managed Care, Other (non HMO) | Source: Ambulatory Visit | Attending: Cardiology | Admitting: Cardiology

## 2017-09-15 DIAGNOSIS — I213 ST elevation (STEMI) myocardial infarction of unspecified site: Secondary | ICD-10-CM

## 2017-09-15 DIAGNOSIS — Z955 Presence of coronary angioplasty implant and graft: Secondary | ICD-10-CM

## 2017-09-15 NOTE — Telephone Encounter (Signed)
Patient wife called in stated that she would like to know if patient should continue with children's chewable 81 mg or change to taking aspirin 81 mg enteric coated. He was told he should only take the children's but I did not see anything in last note.  Please advise on which he should take.  Thank you!

## 2017-09-16 NOTE — Telephone Encounter (Signed)
Called wife, LVMTCB if questions, but did advise of message from [place] regarding the medication.

## 2017-09-16 NOTE — Progress Notes (Signed)
Cardiac Individual Treatment Plan  Patient Details  Name: Casey Cummings MRN: [id number] Date of Birth: Nov 12, 1955 Referring Provider:     CARDIAC REHAB PHASE II ORIENTATION from 08/24/2017 in [place]  Referring Provider  Minus Breeding, MD.      Initial Encounter Date:    CARDIAC REHAB PHASE II ORIENTATION from 08/24/2017 in [place]  Date  08/24/17      Visit Diagnosis: ST elevation myocardial infarction (STEMI), unspecified artery ([place])  Stented coronary artery  Patient's Home Medications on Admission:  Current Outpatient Medications:  .  aspirin 81 MG chewable tablet, Chew 1 tablet (81 mg total) by mouth daily., Disp: 90 tablet, Rfl: 3 .  atorvastatin (LIPITOR) 80 MG tablet, Take 1 tablet (80 mg total) by mouth daily at 6 PM., Disp: 90 tablet, Rfl: 3 .  metoprolol succinate (TOPROL XL) 25 MG 24 hr tablet, Take 0.5 tablets (12.5 mg total) by mouth daily., Disp: 15 tablet, Rfl: 6 .  Multiple Vitamin (MULTIVITAMIN) tablet, Take 1 tablet by mouth daily., Disp: , Rfl:  .  nitroGLYCERIN (NITROSTAT) 0.4 MG SL tablet, Place 1 tablet (0.4 mg total) under the tongue every 5 (five) minutes as needed for chest pain., Disp: 25 tablet, Rfl: 3 .  ticagrelor (BRILINTA) 90 MG TABS tablet, Take 1 tablet (90 mg total) by mouth 2 (two) times daily., Disp: 180 tablet, Rfl: 3  Past Medical History: Past Medical History:  Diagnosis Date  . CAD (coronary artery disease)    a. 07/2017 s/p Ant STEMI/PCI: initially admitted w/ chest pain->Cardiac CTA: Ca2+ 0, >70%LAD stenosis->pt later dev Ant STEMI->Cath: LM nl, LAD 90p (3.5x16 Synergy DES), 28m, LCX nl, RCA nl.  . Diastolic dysfunction    a. 07/2017 Echo: EF 65-70%, mild LVH, Gr1 DD.  Marland Kitchen Dyslipidemia, goal LDL below 70   . Pulmonary nodules    a. 06/2017 Chest CT [facility]): biapical airspace dzs w/ more nodular focus in RUL w/ some associated bronchiectasis and prominent  pleural-parenchymal scarring (seen by Pulm - similar findings on CT neck in 01/2016).    Tobacco Use: Social History   Tobacco Use  Smoking Status Never Smoker  Smokeless Tobacco Never Used    Labs: Recent Review Scientist, physiological    Labs for ITP Cardiac and Pulmonary Rehab Latest Ref Rng & Units 07/08/2017 08/06/2017   Cholestrol 100 - 199 mg/dL 175 92(L)   LDLCALC 0 - 99 mg/dL 102(H) 34   HDL >39 mg/dL 48 41   Trlycerides 0 - 149 mg/dL 126 86   Hemoglobin A1c 4.8 - 5.6 % 5.6 -      Capillary Blood Glucose: Lab Results  Component Value Date   GLUCAP 124 (H) 08/14/2017     Exercise Target Goals:    Exercise Program Goal: Individual exercise prescription set using results from initial 6 min walk test and THRR while considering  patient's activity barriers and safety.   Exercise Prescription Goal: Initial exercise prescription builds to 30-45 minutes a day of aerobic activity, 2-3 days per week.  Home exercise guidelines will be given to patient during program as part of exercise prescription that the participant will acknowledge.  Activity Barriers & Risk Stratification: Activity Barriers & Cardiac Risk Stratification - 08/24/17 0850      Activity Barriers & Cardiac Risk Stratification   Activity Barriers  Other (comment)    Comments  Vertigo    Cardiac Risk Stratification  High  6 Minute Walk: 6 Minute Walk    Row Name 08/24/17 0848         6 Minute Walk   Phase  Initial     Distance  1600 feet     Walk Time  6 minutes     # of Rest Breaks  0     MPH  3.03     METS  4.09     RPE  13     VO2 Peak  14.32     Symptoms  No     Resting HR  66 bpm     Resting BP  124/80     Resting Oxygen Saturation   97 %     Exercise Oxygen Saturation  during 6 min walk  99 %     Max Ex. HR  96 bpm     Max Ex. BP  122/78     2 Minute Post BP  108/68        Oxygen Initial Assessment:   Oxygen Re-Evaluation:   Oxygen Discharge (Final Oxygen  Re-Evaluation):   Initial Exercise Prescription: Initial Exercise Prescription - 08/24/17 1000      Date of Initial Exercise RX and Referring Provider   Date  08/24/17    Referring Provider  Minus Breeding, MD.      Bike   Level  1.2    Minutes  10    METs  4.02      NuStep   Level  4    SPM  85    Minutes  10    METs  3.1      Track   Laps  12    Minutes  10    METs  3.09      Prescription Details   Frequency (times per week)  3    Duration  Progress to 30 minutes of continuous aerobic without signs/symptoms of physical distress      Intensity   THRR 40-80% of Max Heartrate  64-127    Ratings of Perceived Exertion  11-13    Perceived Dyspnea  0-4      Progression   Progression  Continue to progress workloads to maintain intensity without signs/symptoms of physical distress.      Resistance Training   Training Prescription  Yes    Weight  4lbs    Reps  10-15       Perform Capillary Blood Glucose checks as needed.  Exercise Prescription Changes: Exercise Prescription Changes    Row Name 08/30/17 1449 09/06/17 1446           Response to Exercise   Blood Pressure (Admit)  120/74  108/66      Blood Pressure (Exercise)  148/80  142/80      Blood Pressure (Exit)  106/70  118/82      Heart Rate (Admit)  72 bpm  68 bpm      Heart Rate (Exercise)  110 bpm  129 bpm      Heart Rate (Exit)  71 bpm  68 bpm      Rating of Perceived Exertion (Exercise)  13  13      Symptoms  none  none      Duration  Continue with 30 min of aerobic exercise without signs/symptoms of physical distress.  Continue with 30 min of aerobic exercise without signs/symptoms of physical distress.      Intensity  THRR unchanged  THRR unchanged  Progression   Progression  Continue to progress workloads to maintain intensity without signs/symptoms of physical distress.  Continue to progress workloads to maintain intensity without signs/symptoms of physical distress.      Average METs   3.5  5.1        Resistance Training   Training Prescription  Yes  No      Weight  4lbs  -      Reps  10-15  -      Time  10 Minutes  -        Interval Training   Interval Training  No  No        Bike   Level  1.2  2.5      Minutes  10  10      METs  4  7.31        NuStep   Level  4  5      SPM  85  85      Minutes  10  10      METs  3.2  5.3        Track   Laps  14  10      Minutes  10  10      METs  3.43  2.74        Home Exercise Plan   Plans to continue exercise at  -  Home (comment)      Frequency  -  Add 2 additional days to program exercise sessions.      Initial Home Exercises Provided  -  09/06/17         Exercise Comments: Exercise Comments    Row Name 08/30/17 1547 09/06/17 1535         Exercise Comments  Patient tolerated first session of exercise well without c/o.  Reviewed home exercise guidelines, METs and goals with patient.         Exercise Goals and Review: Exercise Goals    Row Name 08/24/17 0848             Exercise Goals   Increase Physical Activity  Yes       Intervention  Provide advice, education, support and counseling about physical activity/exercise needs.;Develop an individualized exercise prescription for aerobic and resistive training based on initial evaluation findings, risk stratification, comorbidities and participant's personal goals.       Expected Outcomes  Short Term: Attend rehab on a regular basis to increase amount of physical activity.;Long Term: Exercising regularly at least 3-5 days a week.;Long Term: Add in home exercise to make exercise part of routine and to increase amount of physical activity.       Increase Strength and Stamina  Yes       Intervention  Provide advice, education, support and counseling about physical activity/exercise needs.;Develop an individualized exercise prescription for aerobic and resistive training based on initial evaluation findings, risk stratification, comorbidities and participant's  personal goals.       Expected Outcomes  Short Term: Increase workloads from initial exercise prescription for resistance, speed, and METs.;Short Term: Perform resistance training exercises routinely during rehab and add in resistance training at home;Long Term: Improve cardiorespiratory fitness, muscular endurance and strength as measured by increased METs and functional capacity (6MWT)       Able to understand and use rate of perceived exertion (RPE) scale  Yes       Intervention  Provide education and explanation on how to use RPE scale  Expected Outcomes  Short Term: Able to use RPE daily in rehab to express subjective intensity level;Long Term:  Able to use RPE to guide intensity level when exercising independently       Knowledge and understanding of Target Heart Rate Range (THRR)  Yes       Intervention  Provide education and explanation of THRR including how the numbers were predicted and where they are located for reference       Expected Outcomes  Short Term: Able to state/look up THRR;Long Term: Able to use THRR to govern intensity when exercising independently;Short Term: Able to use daily as guideline for intensity in rehab       Able to check pulse independently  Yes       Intervention  Provide education and demonstration on how to check pulse in carotid and radial arteries.;Review the importance of being able to check your own pulse for safety during independent exercise       Expected Outcomes  Short Term: Able to explain why pulse checking is important during independent exercise;Long Term: Able to check pulse independently and accurately       Understanding of Exercise Prescription  Yes       Intervention  Provide education, explanation, and written materials on patient's individual exercise prescription       Expected Outcomes  Short Term: Able to explain program exercise prescription;Long Term: Able to explain home exercise prescription to exercise independently           Exercise Goals Re-Evaluation : Exercise Goals Re-Evaluation    Row Name 08/30/17 1547 09/06/17 1535           Exercise Goal Re-Evaluation   Exercise Goals Review  Able to understand and use rate of perceived exertion (RPE) scale  Able to understand and use rate of perceived exertion (RPE) scale;Understanding of Exercise Prescription;Knowledge and understanding of Target Heart Rate Range (THRR)      Comments  Patient is able to understand and use RPE scale appropriately.  Reviewed home exercise guidelines with patient including THRR, RPE scale and endpoints for exercise. Pt states he's walking 30 minutes on Tuesdays and Thursdays.      Expected Outcomes  Increase workloads as tolerated to help achieve personal health and fitness goals.  Patient will walk 30 minutes, 2 days/week in addition to exercise at cardiac rehab.          Discharge Exercise Prescription (Final Exercise Prescription Changes): Exercise Prescription Changes - 09/06/17 1446      Response to Exercise   Blood Pressure (Admit)  108/66    Blood Pressure (Exercise)  142/80    Blood Pressure (Exit)  118/82    Heart Rate (Admit)  68 bpm    Heart Rate (Exercise)  129 bpm    Heart Rate (Exit)  68 bpm    Rating of Perceived Exertion (Exercise)  13    Symptoms  none    Duration  Continue with 30 min of aerobic exercise without signs/symptoms of physical distress.    Intensity  THRR unchanged      Progression   Progression  Continue to progress workloads to maintain intensity without signs/symptoms of physical distress.    Average METs  5.1      Resistance Training   Training Prescription  No      Interval Training   Interval Training  No      Bike   Level  2.5    Minutes  10  METs  7.31      NuStep   Level  5    SPM  85    Minutes  10    METs  5.3      Track   Laps  10    Minutes  10    METs  2.74      Home Exercise Plan   Plans to continue exercise at  Home (comment)    Frequency  Add 2  additional days to program exercise sessions.    Initial Home Exercises Provided  09/06/17       Nutrition:  Target Goals: Understanding of nutrition guidelines, daily intake of sodium 1500mg , cholesterol 200mg , calories 30% from fat and 7% or less from saturated fats, daily to have 5 or more servings of fruits and vegetables.  Biometrics: Pre Biometrics - 08/24/17 1100      Pre Biometrics   Height  5\' 10"  (1.778 m)    Weight  166 lb 3.6 oz (75.4 kg)    Waist Circumference  34.75 inches    Hip Circumference  38.5 inches    Waist to Hip Ratio  0.9 %    BMI (Calculated)  23.85    Triceps Skinfold  13 mm    % Body Fat  22.7 %    Grip Strength  42.5 kg    Flexibility  9.5 in    Single Leg Stand  11.6 seconds        Nutrition Therapy Plan and Nutrition Goals: Nutrition Therapy & Goals - 08/24/17 1152      Nutrition Therapy   Diet  heart healthy      Intervention Plan   Intervention  Prescribe, educate and counsel regarding individualized specific dietary modifications aiming towards targeted core components such as weight, hypertension, lipid management, diabetes, heart failure and other comorbidities.;Nutrition handout(s) given to patient.    Expected Outcomes  Short Term Goal: Understand basic principles of dietary content, such as calories, fat, sodium, cholesterol and nutrients.;Long Term Goal: Adherence to prescribed nutrition plan.       Nutrition Assessments: Nutrition Assessments - 08/24/17 1154      MEDFICTS Scores   Pre Score  12       Nutrition Goals Re-Evaluation:   Nutrition Goals Re-Evaluation:   Nutrition Goals Discharge (Final Nutrition Goals Re-Evaluation):   Psychosocial: Target Goals: Acknowledge presence or absence of significant depression and/or stress, maximize coping skills, provide positive support system. Participant is able to verbalize types and ability to use techniques and skills needed for reducing stress and depression.  Initial  Review & Psychosocial Screening: Initial Psych Review & Screening - 08/24/17 1053      Initial Review   Current issues with  None Identified      Family Dynamics   Good Support System?  Yes wife, children, friends       Barriers   Psychosocial barriers to participate in program  There are no identifiable barriers or psychosocial needs.      Screening Interventions   Interventions  Encouraged to exercise       Quality of Life Scores: Quality of Life - 08/24/17 1054      Quality of Life Scores   Health/Function Pre  20.7 %    Socioeconomic Pre  26.56 %    Psych/Spiritual Pre  24.79 %    Family Pre  28.3 %    GLOBAL Pre  23.96 %      Scores of 19 and below usually indicate  a poorer quality of life in these areas.  A difference of  2-3 points is a clinically meaningful difference.  A difference of 2-3 points in the total score of the Quality of Life Index has been associated with significant improvement in overall quality of life, self-image, physical symptoms, and general health in studies assessing change in quality of life.  PHQ-9: Recent Review Flowsheet Data    Depression screen [facility] 2/9 08/30/2017   Decreased Interest 0   Down, Depressed, Hopeless 0   PHQ - 2 Score 0     Interpretation of Total Score  Total Score Depression Severity:  1-4 = Minimal depression, 5-9 = Mild depression, 10-14 = Moderate depression, 15-19 = Moderately severe depression, 20-27 = Severe depression   Psychosocial Evaluation and Intervention: Psychosocial Evaluation - 08/30/17 1628      Psychosocial Evaluation & Interventions   Interventions  Encouraged to exercise with the program and follow exercise prescription    Comments  no psychosocial needs identified, no interventions necessary.  pt enjoys fishing and is eagerly anticipating resuming this activity.    Expected Outcomes  pt will exhibit positive outlook with good coping skills.     Continue Psychosocial Services   No Follow up required        Psychosocial Re-Evaluation: Psychosocial Re-Evaluation    [place] Name 09/16/17 1422 09/16/17 1423           Psychosocial Re-Evaluation   Current issues with  Current Stress Concerns  -      Comments  Ziggy has a highly stressful job. Will continue to offer support as needed  -      Interventions  Stress management education;Encouraged to attend Cardiac Rehabilitation for the exercise  -      Continue Psychosocial Services   -  Follow up required by staff        Initial Review   Source of Stress Concerns  Occupation  -         Psychosocial Discharge (Final Psychosocial Re-Evaluation): Psychosocial Re-Evaluation - 09/16/17 1423      Psychosocial Re-Evaluation   Continue Psychosocial Services   Follow up required by staff       Vocational Rehabilitation: Provide vocational rehab assistance to qualifying candidates.   Vocational Rehab Evaluation & Intervention: Vocational Rehab - 08/24/17 1053      Initial Vocational Rehab Evaluation & Intervention   Assessment shows need for Vocational Rehabilitation  No       Education: Education Goals: Education classes will be provided on a weekly basis, covering required topics. Participant will state understanding/return demonstration of topics presented.  Learning Barriers/Preferences: Learning Barriers/Preferences - 08/23/17 1228      Learning Barriers/Preferences   Learning Preferences  Written Material;Verbal Instruction;Skilled Demonstration       Education Topics: Count Your Pulse:  -Group instruction provided by verbal instruction, demonstration, patient participation and written materials to support subject.  Instructors address importance of being able to find your pulse and how to count your pulse when at home without a heart monitor.  Patients get hands on experience counting their pulse with staff help and individually.   CARDIAC REHAB PHASE II EXERCISE from 09/15/2017 in [place]  Date  09/03/17  Educator  Jiles Garter  Instruction Review Code  2- Demonstrated Understanding      Heart Attack, Angina, and Risk Factor Modification:  -Group instruction provided by verbal instruction, video, and written materials to support subject.  Instructors address signs  and symptoms of angina and heart attacks.    Also discuss risk factors for heart disease and how to make changes to improve heart health risk factors.   Functional Fitness:  -Group instruction provided by verbal instruction, demonstration, patient participation, and written materials to support subject.  Instructors address safety measures for doing things around the house.  Discuss how to get up and down off the floor, how to pick things up properly, how to safely get out of a chair without assistance, and balance training.   Meditation and Mindfulness:  -Group instruction provided by verbal instruction, patient participation, and written materials to support subject.  Instructor addresses importance of mindfulness and meditation practice to help reduce stress and improve awareness.  Instructor also leads participants through a meditation exercise.    Stretching for Flexibility and Mobility:  -Group instruction provided by verbal instruction, patient participation, and written materials to support subject.  Instructors lead participants through series of stretches that are designed to increase flexibility thus improving mobility.  These stretches are additional exercise for major muscle groups that are typically performed during regular warm up and cool down.   Hands Only CPR:  -Group verbal, video, and participation provides a basic overview of AHA guidelines for community CPR. Role-play of emergencies allow participants the opportunity to practice calling for help and chest compression technique with discussion of AED use.   Hypertension: -Group verbal and written instruction that provides a basic  overview of hypertension including the most recent diagnostic guidelines, risk factor reduction with self-care instructions and medication management.    Nutrition I class: Heart Healthy Eating:  -Group instruction provided by PowerPoint slides, verbal discussion, and written materials to support subject matter. The instructor gives an explanation and review of the Therapeutic Lifestyle Changes diet recommendations, which includes a discussion on lipid goals, dietary fat, sodium, fiber, plant stanol/sterol esters, sugar, and the components of a well-balanced, healthy diet.   Nutrition II class: Lifestyle Skills:  -Group instruction provided by PowerPoint slides, verbal discussion, and written materials to support subject matter. The instructor gives an explanation and review of label reading, grocery shopping for heart health, heart healthy recipe modifications, and ways to make healthier choices when eating out.   Diabetes Question & Answer:  -Group instruction provided by PowerPoint slides, verbal discussion, and written materials to support subject matter. The instructor gives an explanation and review of diabetes co-morbidities, pre- and post-prandial blood glucose goals, pre-exercise blood glucose goals, signs, symptoms, and treatment of hypoglycemia and hyperglycemia, and foot care basics.   Diabetes Blitz:  -Group instruction provided by PowerPoint slides, verbal discussion, and written materials to support subject matter. The instructor gives an explanation and review of the physiology behind type 1 and type 2 diabetes, diabetes medications and rational behind using different medications, pre- and post-prandial blood glucose recommendations and Hemoglobin A1c goals, diabetes diet, and exercise including blood glucose guidelines for exercising safely.    Portion Distortion:  -Group instruction provided by PowerPoint slides, verbal discussion, written materials, and food models to support  subject matter. The instructor gives an explanation of serving size versus portion size, changes in portions sizes over the last 20 years, and what consists of a serving from each food group.   Stress Management:  -Group instruction provided by verbal instruction, video, and written materials to support subject matter.  Instructors review role of stress in heart disease and how to cope with stress positively.     Exercising on Your Own:  -Group  instruction provided by verbal instruction, power point, and written materials to support subject.  Instructors discuss benefits of exercise, components of exercise, frequency and intensity of exercise, and end points for exercise.  Also discuss use of nitroglycerin and activating EMS.  Review options of places to exercise outside of rehab.  Review guidelines for sex with heart disease.   CARDIAC REHAB PHASE II EXERCISE from 09/15/2017 in [place]  Date  09/01/17  Educator  EP  Instruction Review Code  2- Demonstrated Understanding      Cardiac Drugs I:  -Group instruction provided by verbal instruction and written materials to support subject.  Instructor reviews cardiac drug classes: antiplatelets, anticoagulants, beta blockers, and statins.  Instructor discusses reasons, side effects, and lifestyle considerations for each drug class.   CARDIAC REHAB PHASE II EXERCISE from 09/15/2017 in [place]  Date  09/15/17  Educator  Pharmacist  Instruction Review Code  2- Demonstrated Understanding      Cardiac Drugs II:  -Group instruction provided by verbal instruction and written materials to support subject.  Instructor reviews cardiac drug classes: angiotensin converting enzyme inhibitors (ACE-I), angiotensin II receptor blockers (ARBs), nitrates, and calcium channel blockers.  Instructor discusses reasons, side effects, and lifestyle considerations for each drug class.   Anatomy and  Physiology of the Circulatory System:  Group verbal and written instruction and models provide basic cardiac anatomy and physiology, with the coronary electrical and arterial systems. Review of: AMI, Angina, Valve disease, Heart Failure, Peripheral Artery Disease, Cardiac Arrhythmia, Pacemakers, and the ICD.   Other Education:  -Group or individual verbal, written, or video instructions that support the educational goals of the cardiac rehab program.   Holiday Eating Survival Tips:  -Group instruction provided by PowerPoint slides, verbal discussion, and written materials to support subject matter. The instructor gives patients tips, tricks, and techniques to help them not only survive but enjoy the holidays despite the onslaught of food that accompanies the holidays.   Knowledge Questionnaire Score: Knowledge Questionnaire Score - 08/24/17 1036      Knowledge Questionnaire Score   Pre Score  24/24       Core Components/Risk Factors/Patient Goals at Admission: Personal Goals and Risk Factors at Admission - 08/24/17 1049      Core Components/Risk Factors/Patient Goals on Admission   Lipids  Yes    Intervention  Provide education and support for participant on nutrition & aerobic/resistive exercise along with prescribed medications to achieve LDL 70mg , HDL >40mg .    Expected Outcomes  Short Term: Participant states understanding of desired cholesterol values and is compliant with medications prescribed. Participant is following exercise prescription and nutrition guidelines.;Long Term: Cholesterol controlled with medications as prescribed, with individualized exercise RX and with personalized nutrition plan. Value goals: LDL < 70mg , HDL > 40 mg.       Core Components/Risk Factors/Patient Goals Review:  Goals and Risk Factor Review    Row Name 08/30/17 1626 09/16/17 1420           Core Components/Risk Factors/Patient Goals Review   Personal Goals Review  Lipids  Lipids;Stress       Review  pt with minimal CAD RF demontrates eagerness to participate in CR program. pt personal goals are to learn exercise guidelines and learn safe exercise thresholds.  pt with minimal CAD RF demontrates eagerness to participate in CR program. pt personal goals are to learn exercise guidelines and learn safe exercise thresholds.  Expected Outcomes  pt will participate in CR exercise, nutrition and lifestyle modification to decrease overall RF.   pt will participate in CR exercise, nutrition and lifestyle modification to decrease overall RF.          Core Components/Risk Factors/Patient Goals at Discharge (Final Review):  Goals and Risk Factor Review - 09/16/17 1420      Core Components/Risk Factors/Patient Goals Review   Personal Goals Review  Lipids;Stress    Review  pt with minimal CAD RF demontrates eagerness to participate in CR program. pt personal goals are to learn exercise guidelines and learn safe exercise thresholds.    Expected Outcomes  pt will participate in CR exercise, nutrition and lifestyle modification to decrease overall RF.        ITP Comments: ITP Comments    Row Name 08/23/17 1227 08/30/17 1625 09/16/17 1417       ITP Comments  Dr. Fransico Him, Medical Director   pt started group exercise. pt tolerated light activity without difficulty. pt oriented to exercise equipment and safety routine.   30 Day ITP Review. Patient with good attendance and partcipation in phase 2 cardiac rehab        Comments: See ITP comments.Barnet Pall, RN,BSN 09/16/2017 2:25 PM

## 2017-09-16 NOTE — Telephone Encounter (Signed)
Either is fine

## 2017-09-17 ENCOUNTER — Encounter (HOSPITAL_COMMUNITY)
Admission: RE | Admit: 2017-09-17 | Discharge: 2017-09-17 | Disposition: A | Discharge status: Home / Self Care | Payer: Managed Care, Other (non HMO) | Source: Ambulatory Visit | Attending: Cardiology | Admitting: Cardiology

## 2017-09-17 DIAGNOSIS — Z955 Presence of coronary angioplasty implant and graft: Secondary | ICD-10-CM

## 2017-09-17 DIAGNOSIS — I213 ST elevation (STEMI) myocardial infarction of unspecified site: Secondary | ICD-10-CM

## 2017-09-20 ENCOUNTER — Telehealth (HOSPITAL_COMMUNITY): Payer: Self-pay | Admitting: *Deleted

## 2017-09-20 ENCOUNTER — Encounter (HOSPITAL_COMMUNITY)
Admission: RE | Admit: 2017-09-20 | Discharge: 2017-09-20 | Disposition: A | Discharge status: Home / Self Care | Payer: Managed Care, Other (non HMO) | Source: Ambulatory Visit | Attending: Cardiology | Admitting: Cardiology

## 2017-09-20 DIAGNOSIS — Z955 Presence of coronary angioplasty implant and graft: Secondary | ICD-10-CM

## 2017-09-20 DIAGNOSIS — I213 ST elevation (STEMI) myocardial infarction of unspecified site: Secondary | ICD-10-CM

## 2017-09-20 NOTE — Telephone Encounter (Signed)
-----   Message from Minus Breeding, MD sent at 09/18/2017 10:03 AM EDT ----- Regarding: RE: Increase exercise target heart rate OK to increase heart rate goal ----- Message ----- From: Sol Passer Sent: 09/17/2017   5:43 PM To: Minus Breeding, MD Subject: Increase exercise target heart rate            Greetings Dr Percival Spanish,  Your patient Casey Cummings has been in the cardiac rehabilitation program approximately 5 weeks and is doing well. Patient is exceeding current target heart rate range of 64-127 bpm (40-80% of his age predicted max HR) at his current workloads. If no GXT is planned, we would like to increase his THRR to an upper limit of 151 bpm (95% age predicted max HR). Please indicate if you are agreeable to this change in exercise prescription.  We appreciate your assistance and referral of your patient to our program.  Sol Passer, MS, ACSM CEP

## 2017-09-22 ENCOUNTER — Encounter (HOSPITAL_COMMUNITY)
Admission: RE | Admit: 2017-09-22 | Discharge: 2017-09-22 | Disposition: A | Discharge status: Home / Self Care | Payer: Managed Care, Other (non HMO) | Source: Ambulatory Visit | Attending: Cardiology | Admitting: Cardiology

## 2017-09-22 DIAGNOSIS — I213 ST elevation (STEMI) myocardial infarction of unspecified site: Secondary | ICD-10-CM

## 2017-09-22 DIAGNOSIS — Z955 Presence of coronary angioplasty implant and graft: Secondary | ICD-10-CM

## 2017-09-24 ENCOUNTER — Encounter (HOSPITAL_COMMUNITY)
Admission: RE | Admit: 2017-09-24 | Discharge: 2017-09-24 | Disposition: A | Discharge status: Home / Self Care | Payer: Managed Care, Other (non HMO) | Source: Ambulatory Visit | Attending: Cardiology | Admitting: Cardiology

## 2017-09-24 DIAGNOSIS — I213 ST elevation (STEMI) myocardial infarction of unspecified site: Secondary | ICD-10-CM

## 2017-09-24 DIAGNOSIS — Z955 Presence of coronary angioplasty implant and graft: Secondary | ICD-10-CM

## 2017-09-27 ENCOUNTER — Encounter (HOSPITAL_COMMUNITY)
Admission: RE | Admit: 2017-09-27 | Discharge: 2017-09-27 | Disposition: A | Discharge status: Home / Self Care | Payer: Managed Care, Other (non HMO) | Source: Ambulatory Visit | Attending: Cardiology | Admitting: Cardiology

## 2017-09-27 DIAGNOSIS — I213 ST elevation (STEMI) myocardial infarction of unspecified site: Secondary | ICD-10-CM

## 2017-09-27 DIAGNOSIS — Z955 Presence of coronary angioplasty implant and graft: Secondary | ICD-10-CM

## 2017-09-29 ENCOUNTER — Encounter (HOSPITAL_COMMUNITY)
Admission: RE | Admit: 2017-09-29 | Discharge: 2017-09-29 | Disposition: A | Discharge status: Home / Self Care | Payer: Managed Care, Other (non HMO) | Source: Ambulatory Visit | Attending: Cardiology | Admitting: Cardiology

## 2017-09-29 DIAGNOSIS — I213 ST elevation (STEMI) myocardial infarction of unspecified site: Secondary | ICD-10-CM | POA: Diagnosis not present

## 2017-09-29 DIAGNOSIS — Z955 Presence of coronary angioplasty implant and graft: Secondary | ICD-10-CM

## 2017-10-01 ENCOUNTER — Encounter (HOSPITAL_COMMUNITY)
Admission: RE | Admit: 2017-10-01 | Discharge: 2017-10-01 | Disposition: A | Discharge status: Home / Self Care | Payer: Managed Care, Other (non HMO) | Source: Ambulatory Visit | Attending: Cardiology | Admitting: Cardiology

## 2017-10-01 DIAGNOSIS — I213 ST elevation (STEMI) myocardial infarction of unspecified site: Secondary | ICD-10-CM | POA: Diagnosis not present

## 2017-10-01 DIAGNOSIS — Z955 Presence of coronary angioplasty implant and graft: Secondary | ICD-10-CM

## 2017-10-04 ENCOUNTER — Encounter (HOSPITAL_COMMUNITY)
Admission: RE | Admit: 2017-10-04 | Discharge: 2017-10-04 | Disposition: A | Discharge status: Home / Self Care | Payer: Managed Care, Other (non HMO) | Source: Ambulatory Visit | Attending: Cardiology | Admitting: Cardiology

## 2017-10-04 DIAGNOSIS — I213 ST elevation (STEMI) myocardial infarction of unspecified site: Secondary | ICD-10-CM

## 2017-10-04 DIAGNOSIS — Z955 Presence of coronary angioplasty implant and graft: Secondary | ICD-10-CM

## 2017-10-06 ENCOUNTER — Encounter (HOSPITAL_COMMUNITY)
Admission: RE | Admit: 2017-10-06 | Discharge: 2017-10-06 | Disposition: A | Discharge status: Home / Self Care | Payer: Managed Care, Other (non HMO) | Source: Ambulatory Visit | Attending: Cardiology | Admitting: Cardiology

## 2017-10-06 DIAGNOSIS — I213 ST elevation (STEMI) myocardial infarction of unspecified site: Secondary | ICD-10-CM | POA: Diagnosis not present

## 2017-10-06 DIAGNOSIS — Z955 Presence of coronary angioplasty implant and graft: Secondary | ICD-10-CM

## 2017-10-06 NOTE — Progress Notes (Signed)
Casey Cummings reports experiencing a fluttering sensation on Monday while he was walking with his wife and yesterday. Casey Cummings reports that he noticed feeling a fluttering feeling while on the nustep today at cardiac rehab. Blood pressure 120/76. Patient denies having any chest pain or other symptoms. Angie Duke PA called and notified. Today's ECG tracings faxed to Doreene Adas PA for review. Angie review ECG tracings. No new order received.Will continue to monitor the patient throughout  the program. Casey Cummings had no complaints upon exit from cardiac rehab.Barnet Pall, RN,BSN 10/06/2017 4:55 PM

## 2017-10-08 ENCOUNTER — Encounter (HOSPITAL_COMMUNITY)
Admission: RE | Admit: 2017-10-08 | Discharge: 2017-10-08 | Disposition: A | Discharge status: Home / Self Care | Payer: Managed Care, Other (non HMO) | Source: Ambulatory Visit | Attending: Cardiology | Admitting: Cardiology

## 2017-10-08 DIAGNOSIS — Z7982 Long term (current) use of aspirin: Secondary | ICD-10-CM | POA: Diagnosis not present

## 2017-10-08 DIAGNOSIS — Z955 Presence of coronary angioplasty implant and graft: Secondary | ICD-10-CM | POA: Diagnosis present

## 2017-10-08 DIAGNOSIS — I213 ST elevation (STEMI) myocardial infarction of unspecified site: Secondary | ICD-10-CM | POA: Diagnosis present

## 2017-10-08 DIAGNOSIS — I2510 Atherosclerotic heart disease of native coronary artery without angina pectoris: Secondary | ICD-10-CM | POA: Insufficient documentation

## 2017-10-08 DIAGNOSIS — Z79899 Other long term (current) drug therapy: Secondary | ICD-10-CM | POA: Insufficient documentation

## 2017-10-08 DIAGNOSIS — E785 Hyperlipidemia, unspecified: Secondary | ICD-10-CM | POA: Insufficient documentation

## 2017-10-11 ENCOUNTER — Encounter (HOSPITAL_COMMUNITY)
Admission: RE | Admit: 2017-10-11 | Discharge: 2017-10-11 | Disposition: A | Discharge status: Home / Self Care | Payer: Managed Care, Other (non HMO) | Source: Ambulatory Visit | Attending: Cardiology | Admitting: Cardiology

## 2017-10-11 DIAGNOSIS — I213 ST elevation (STEMI) myocardial infarction of unspecified site: Secondary | ICD-10-CM | POA: Diagnosis not present

## 2017-10-11 DIAGNOSIS — Z955 Presence of coronary angioplasty implant and graft: Secondary | ICD-10-CM

## 2017-10-12 ENCOUNTER — Telehealth: Payer: Self-pay | Admitting: Emergency Medicine

## 2017-10-12 NOTE — Telephone Encounter (Signed)
Called and spoke with patients wife, she was under the impression that the CT had already been scheduled I advised her that it has not been scheduled yet. Advised patients wife that it would be scheduled closer to the time it was ordered. Nothing further needed.

## 2017-10-13 ENCOUNTER — Telehealth: Payer: Self-pay | Admitting: Physician Assistant

## 2017-10-13 ENCOUNTER — Encounter (HOSPITAL_COMMUNITY)
Admission: RE | Admit: 2017-10-13 | Discharge: 2017-10-13 | Disposition: A | Discharge status: Home / Self Care | Payer: Managed Care, Other (non HMO) | Source: Ambulatory Visit | Attending: Cardiology | Admitting: Cardiology

## 2017-10-13 DIAGNOSIS — I213 ST elevation (STEMI) myocardial infarction of unspecified site: Secondary | ICD-10-CM | POA: Diagnosis not present

## 2017-10-13 DIAGNOSIS — Z955 Presence of coronary angioplasty implant and graft: Secondary | ICD-10-CM

## 2017-10-13 MED ORDER — METOPROLOL SUCCINATE ER 25 MG PO TB24: 12.5000 mg | ORAL_TABLET | Freq: Every day | ORAL | 1 refills | Status: DC

## 2017-10-13 NOTE — Telephone Encounter (Signed)
New message     *STAT* If patient is at the pharmacy, call can be transferred to refill team.   1. Which medications need to be refilled? (please list name of each medication and dose if known) metoprolol succinate (TOPROL XL) 25 MG 24 hr tablet  2. Which pharmacy/location (including street and city if local pharmacy) is medication to be sent to? [id number] - [place], [place] [address]  3. Do they need a 30 day or 90 day supply? [place]

## 2017-10-13 NOTE — Telephone Encounter (Signed)
This is Dr. Hochrein's pt. °

## 2017-10-14 NOTE — Progress Notes (Signed)
Cardiac Individual Treatment Plan  Patient Details  Name: Casey Cummings MRN: [id number] Date of Birth: February 14, 1956 Referring Provider:     CARDIAC REHAB PHASE II ORIENTATION from 08/24/2017 in [place]  Referring Provider  Minus Breeding, MD.      Initial Encounter Date:    CARDIAC REHAB PHASE II ORIENTATION from 08/24/2017 in [place]  Date  08/24/17      Visit Diagnosis: ST elevation myocardial infarction (STEMI), unspecified artery ([place])  Stented coronary artery  Patient's Home Medications on Admission:  Current Outpatient Medications:  .  aspirin 81 MG chewable tablet, Chew 1 tablet (81 mg total) by mouth daily., Disp: 90 tablet, Rfl: 3 .  atorvastatin (LIPITOR) 80 MG tablet, Take 1 tablet (80 mg total) by mouth daily at 6 PM., Disp: 90 tablet, Rfl: 3 .  metoprolol succinate (TOPROL XL) 25 MG 24 hr tablet, Take 0.5 tablets (12.5 mg total) by mouth daily., Disp: 45 tablet, Rfl: 1 .  Multiple Vitamin (MULTIVITAMIN) tablet, Take 1 tablet by mouth daily., Disp: , Rfl:  .  nitroGLYCERIN (NITROSTAT) 0.4 MG SL tablet, Place 1 tablet (0.4 mg total) under the tongue every 5 (five) minutes as needed for chest pain., Disp: 25 tablet, Rfl: 3 .  ticagrelor (BRILINTA) 90 MG TABS tablet, Take 1 tablet (90 mg total) by mouth 2 (two) times daily., Disp: 180 tablet, Rfl: 3  Past Medical History: Past Medical History:  Diagnosis Date  . CAD (coronary artery disease)    a. 07/2017 s/p Ant STEMI/PCI: initially admitted w/ chest pain->Cardiac CTA: Ca2+ 0, >70%LAD stenosis->pt later dev Ant STEMI->Cath: LM nl, LAD 90p (3.5x16 Synergy DES), 36m, LCX nl, RCA nl.  . Diastolic dysfunction    a. 07/2017 Echo: EF 65-70%, mild LVH, Gr1 DD.  Marland Kitchen Dyslipidemia, goal LDL below 70   . Pulmonary nodules    a. 06/2017 Chest CT [facility]): biapical airspace dzs w/ more nodular focus in RUL w/ some associated bronchiectasis and prominent  pleural-parenchymal scarring (seen by Pulm - similar findings on CT neck in 01/2016).    Tobacco Use: Social History   Tobacco Use  Smoking Status Never Smoker  Smokeless Tobacco Never Used    Labs: Recent Review Scientist, physiological    Labs for ITP Cardiac and Pulmonary Rehab Latest Ref Rng & Units 07/08/2017 08/06/2017   Cholestrol 100 - 199 mg/dL 175 92(L)   LDLCALC 0 - 99 mg/dL 102(H) 34   HDL >39 mg/dL 48 41   Trlycerides 0 - 149 mg/dL 126 86   Hemoglobin A1c 4.8 - 5.6 % 5.6 -      Capillary Blood Glucose: Lab Results  Component Value Date   GLUCAP 124 (H) 08/14/2017     Exercise Target Goals:    Exercise Program Goal: Individual exercise prescription set using results from initial 6 min walk test and THRR while considering  patient's activity barriers and safety.   Exercise Prescription Goal: Initial exercise prescription builds to 30-45 minutes a day of aerobic activity, 2-3 days per week.  Home exercise guidelines will be given to patient during program as part of exercise prescription that the participant will acknowledge.  Activity Barriers & Risk Stratification: Activity Barriers & Cardiac Risk Stratification - 08/24/17 0850      Activity Barriers & Cardiac Risk Stratification   Activity Barriers  Other (comment)    Comments  Vertigo    Cardiac Risk Stratification  High  6 Minute Walk: 6 Minute Walk    Row Name 08/24/17 0848         6 Minute Walk   Phase  Initial     Distance  1600 feet     Walk Time  6 minutes     # of Rest Breaks  0     MPH  3.03     METS  4.09     RPE  13     VO2 Peak  14.32     Symptoms  No     Resting HR  66 bpm     Resting BP  124/80     Resting Oxygen Saturation   97 %     Exercise Oxygen Saturation  during 6 min walk  99 %     Max Ex. HR  96 bpm     Max Ex. BP  122/78     2 Minute Post BP  108/68        Oxygen Initial Assessment:   Oxygen Re-Evaluation:   Oxygen Discharge (Final Oxygen  Re-Evaluation):   Initial Exercise Prescription: Initial Exercise Prescription - 08/24/17 1000      Date of Initial Exercise RX and Referring Provider   Date  08/24/17    Referring Provider  Minus Breeding, MD.      Bike   Level  1.2    Minutes  10    METs  4.02      NuStep   Level  4    SPM  85    Minutes  10    METs  3.1      Track   Laps  12    Minutes  10    METs  3.09      Prescription Details   Frequency (times per week)  3    Duration  Progress to 30 minutes of continuous aerobic without signs/symptoms of physical distress      Intensity   THRR 40-80% of Max Heartrate  64-127    Ratings of Perceived Exertion  11-13    Perceived Dyspnea  0-4      Progression   Progression  Continue to progress workloads to maintain intensity without signs/symptoms of physical distress.      Resistance Training   Training Prescription  Yes    Weight  4lbs    Reps  10-15       Perform Capillary Blood Glucose checks as needed.  Exercise Prescription Changes: Exercise Prescription Changes    Row Name 08/30/17 1449 09/06/17 1446 09/20/17 1447 10/04/17 1447       Response to Exercise   Blood Pressure (Admit)  120/74  108/66  112/74  108/70    Blood Pressure (Exercise)  148/80  142/80  142/80  128/72    Blood Pressure (Exit)  106/70  118/82  106/76  104/60    Heart Rate (Admit)  72 bpm  68 bpm  72 bpm  81 bpm    Heart Rate (Exercise)  110 bpm  129 bpm  130 bpm  126 bpm    Heart Rate (Exit)  71 bpm  68 bpm  72 bpm  81 bpm    Rating of Perceived Exertion (Exercise)  13  13  14  15     Symptoms  none  none  none  none    Duration  Continue with 30 min of aerobic exercise without signs/symptoms of physical distress.  Continue with 30 min of aerobic exercise without  signs/symptoms of physical distress.  Continue with 30 min of aerobic exercise without signs/symptoms of physical distress.  Continue with 30 min of aerobic exercise without signs/symptoms of physical distress.     Intensity  THRR unchanged  THRR unchanged  THRR New up to 151 bpm  THRR unchanged      Progression   Progression  Continue to progress workloads to maintain intensity without signs/symptoms of physical distress.  Continue to progress workloads to maintain intensity without signs/symptoms of physical distress.  Continue to progress workloads to maintain intensity without signs/symptoms of physical distress.  Continue to progress workloads to maintain intensity without signs/symptoms of physical distress.    Average METs  3.5  5.1  6.4  6.7      Resistance Training   Training Prescription  Yes  No  Yes  Yes    Weight  4lbs  -  5lbs  5lbs    Reps  10-15  -  10-15  10-15    Time  10 Minutes  -  10 Minutes  10 Minutes      Interval Training   Interval Training  No  No  No  No      Bike   Level  1.2  2.5  2.3  2.3    Minutes  10  10  10  10     METs  4  7.31  6.8  6.89      NuStep   Level  4  5  5  6     SPM  85  85  85  85    Minutes  10  10  10  10     METs  3.2  5.3  6.1  6.2      Rower   Level  -  -  5  5    Watts  -  -  60  75    Minutes  -  -  10  10    METs  -  -  6.2  6.9      Track   Laps  14  10  -  -    Minutes  10  10  -  -    METs  3.43  2.74  -  -      Home Exercise Plan   Plans to continue exercise at  -  Home (comment)  Home (comment)  Home (comment)    Frequency  -  Add 2 additional days to program exercise sessions.  Add 2 additional days to program exercise sessions.  Add 2 additional days to program exercise sessions.    Initial Home Exercises Provided  -  09/06/17  09/06/17  09/06/17       Exercise Comments: Exercise Comments    Row Name 08/30/17 1547 09/06/17 1535 09/20/17 1516 10/04/17 1501     Exercise Comments  Patient tolerated first session of exercise well without c/o.  Reviewed home exercise guidelines, METs and goals with patient.  Reviewed METs with patient. Received clearance to increase THRR to 151 bpm.  Reviewed METs and goals with patient.         Exercise Goals and Review: Exercise Goals    Row Name 08/24/17 0848             Exercise Goals   Increase Physical Activity  Yes       Intervention  Provide advice, education, support and counseling about physical activity/exercise needs.;Develop an individualized exercise prescription for aerobic and  resistive training based on initial evaluation findings, risk stratification, comorbidities and participant's personal goals.       Expected Outcomes  Short Term: Attend rehab on a regular basis to increase amount of physical activity.;Long Term: Exercising regularly at least 3-5 days a week.;Long Term: Add in home exercise to make exercise part of routine and to increase amount of physical activity.       Increase Strength and Stamina  Yes       Intervention  Provide advice, education, support and counseling about physical activity/exercise needs.;Develop an individualized exercise prescription for aerobic and resistive training based on initial evaluation findings, risk stratification, comorbidities and participant's personal goals.       Expected Outcomes  Short Term: Increase workloads from initial exercise prescription for resistance, speed, and METs.;Short Term: Perform resistance training exercises routinely during rehab and add in resistance training at home;Long Term: Improve cardiorespiratory fitness, muscular endurance and strength as measured by increased METs and functional capacity (6MWT)       Able to understand and use rate of perceived exertion (RPE) scale  Yes       Intervention  Provide education and explanation on how to use RPE scale       Expected Outcomes  Short Term: Able to use RPE daily in rehab to express subjective intensity level;Long Term:  Able to use RPE to guide intensity level when exercising independently       Knowledge and understanding of Target Heart Rate Range (THRR)  Yes       Intervention  Provide education and explanation of THRR including how the  numbers were predicted and where they are located for reference       Expected Outcomes  Short Term: Able to state/look up THRR;Long Term: Able to use THRR to govern intensity when exercising independently;Short Term: Able to use daily as guideline for intensity in rehab       Able to check pulse independently  Yes       Intervention  Provide education and demonstration on how to check pulse in carotid and radial arteries.;Review the importance of being able to check your own pulse for safety during independent exercise       Expected Outcomes  Short Term: Able to explain why pulse checking is important during independent exercise;Long Term: Able to check pulse independently and accurately       Understanding of Exercise Prescription  Yes       Intervention  Provide education, explanation, and written materials on patient's individual exercise prescription       Expected Outcomes  Short Term: Able to explain program exercise prescription;Long Term: Able to explain home exercise prescription to exercise independently          Exercise Goals Re-Evaluation : Exercise Goals Re-Evaluation    Row Name 08/30/17 1547 09/06/17 1535 10/04/17 1501         Exercise Goal Re-Evaluation   Exercise Goals Review  Able to understand and use rate of perceived exertion (RPE) scale  Able to understand and use rate of perceived exertion (RPE) scale;Understanding of Exercise Prescription;Knowledge and understanding of Target Heart Rate Range (THRR)  -     Comments  Patient is able to understand and use RPE scale appropriately.  Reviewed home exercise guidelines with patient including THRR, RPE scale and endpoints for exercise. Pt states he's walking 30 minutes on Tuesdays and Thursdays.  Patient continues to progress well with exercise achieving 6.7 METs with exercise.  Expected Outcomes  Increase workloads as tolerated to help achieve personal health and fitness goals.  Patient will walk 30 minutes, 2 days/week in  addition to exercise at cardiac rehab.  Continue walking at home in addition to progressing workloads at CR to help achieve personal health and fitness goals.         Discharge Exercise Prescription (Final Exercise Prescription Changes): Exercise Prescription Changes - 10/04/17 1447      Response to Exercise   Blood Pressure (Admit)  108/70    Blood Pressure (Exercise)  128/72    Blood Pressure (Exit)  104/60    Heart Rate (Admit)  81 bpm    Heart Rate (Exercise)  126 bpm    Heart Rate (Exit)  81 bpm    Rating of Perceived Exertion (Exercise)  15    Symptoms  none    Duration  Continue with 30 min of aerobic exercise without signs/symptoms of physical distress.    Intensity  THRR unchanged      Progression   Progression  Continue to progress workloads to maintain intensity without signs/symptoms of physical distress.    Average METs  6.7      Resistance Training   Training Prescription  Yes    Weight  5lbs    Reps  10-15    Time  10 Minutes      Interval Training   Interval Training  No      Bike   Level  2.3    Minutes  10    METs  6.89      NuStep   Level  6    SPM  85    Minutes  10    METs  6.2      Rower   Level  5    Watts  75    Minutes  10    METs  6.9      Home Exercise Plan   Plans to continue exercise at  Home (comment)    Frequency  Add 2 additional days to program exercise sessions.    Initial Home Exercises Provided  09/06/17       Nutrition:  Target Goals: Understanding of nutrition guidelines, daily intake of sodium 1500mg , cholesterol 200mg , calories 30% from fat and 7% or less from saturated fats, daily to have 5 or more servings of fruits and vegetables.  Biometrics: Pre Biometrics - 08/24/17 1100      Pre Biometrics   Height  5\' 10"  (1.778 m)    Weight  75.4 kg    Waist Circumference  34.75 inches    Hip Circumference  38.5 inches    Waist to Hip Ratio  0.9 %    BMI (Calculated)  23.85    Triceps Skinfold  13 mm    % Body  Fat  22.7 %    Grip Strength  42.5 kg    Flexibility  9.5 in    Single Leg Stand  11.6 seconds        Nutrition Therapy Plan and Nutrition Goals: Nutrition Therapy & Goals - 08/24/17 1152      Nutrition Therapy   Diet  heart healthy      Intervention Plan   Intervention  Prescribe, educate and counsel regarding individualized specific dietary modifications aiming towards targeted core components such as weight, hypertension, lipid management, diabetes, heart failure and other comorbidities.;Nutrition handout(s) given to patient.    Expected Outcomes  Short Term Goal: Understand basic principles of dietary  content, such as calories, fat, sodium, cholesterol and nutrients.;Long Term Goal: Adherence to prescribed nutrition plan.       Nutrition Assessments: Nutrition Assessments - 08/24/17 1154      MEDFICTS Scores   Pre Score  12       Nutrition Goals Re-Evaluation:   Nutrition Goals Re-Evaluation:   Nutrition Goals Discharge (Final Nutrition Goals Re-Evaluation):   Psychosocial: Target Goals: Acknowledge presence or absence of significant depression and/or stress, maximize coping skills, provide positive support system. Participant is able to verbalize types and ability to use techniques and skills needed for reducing stress and depression.  Initial Review & Psychosocial Screening: Initial Psych Review & Screening - 08/24/17 1053      Initial Review   Current issues with  None Identified      Family Dynamics   Good Support System?  Yes      Barriers   Psychosocial barriers to participate in program  There are no identifiable barriers or psychosocial needs.      Screening Interventions   Interventions  Encouraged to exercise       Quality of Life Scores: Quality of Life - 08/24/17 1054      Quality of Life Scores   Health/Function Pre  20.7 %    Socioeconomic Pre  26.56 %    Psych/Spiritual Pre  24.79 %    Family Pre  28.3 %    GLOBAL Pre  23.96 %       Scores of 19 and below usually indicate a poorer quality of life in these areas.  A difference of  2-3 points is a clinically meaningful difference.  A difference of 2-3 points in the total score of the Quality of Life Index has been associated with significant improvement in overall quality of life, self-image, physical symptoms, and general health in studies assessing change in quality of life.  PHQ-9: Recent Review Flowsheet Data    Depression screen [facility] 2/9 08/30/2017   Decreased Interest 0   Down, Depressed, Hopeless 0   PHQ - 2 Score 0     Interpretation of Total Score  Total Score Depression Severity:  1-4 = Minimal depression, 5-9 = Mild depression, 10-14 = Moderate depression, 15-19 = Moderately severe depression, 20-27 = Severe depression   Psychosocial Evaluation and Intervention: Psychosocial Evaluation - 08/30/17 1628      Psychosocial Evaluation & Interventions   Interventions  Encouraged to exercise with the program and follow exercise prescription    Comments  no psychosocial needs identified, no interventions necessary.  pt enjoys fishing and is eagerly anticipating resuming this activity.    Expected Outcomes  pt will exhibit positive outlook with good coping skills.     Continue Psychosocial Services   No Follow up required       Psychosocial Re-Evaluation: Psychosocial Re-Evaluation    [place] Name 09/16/17 1422 09/16/17 1423 10/14/17 1439         Psychosocial Re-Evaluation   Current issues with  Current Stress Concerns  -  Current Stress Concerns     Comments  Doug has a highly stressful job. Will continue to offer support as needed  -  [place] has a highly stressful job. Will continue to offer support as needed     Interventions  Stress management education;Encouraged to attend Cardiac Rehabilitation for the exercise  -  Stress management education;Encouraged to attend Cardiac Rehabilitation for the exercise     Continue Psychosocial Services   -  Follow up  required  by staff  Follow up required by staff       Initial Review   Source of Stress Concerns  Occupation  -  Occupation        Psychosocial Discharge (Final Psychosocial Re-Evaluation): Psychosocial Re-Evaluation - 10/14/17 1439      Psychosocial Re-Evaluation   Current issues with  Current Stress Concerns    Comments  Kyair has a highly stressful job. Will continue to offer support as needed    Interventions  Stress management education;Encouraged to attend Cardiac Rehabilitation for the exercise    Continue Psychosocial Services   Follow up required by staff      Initial Review   Source of Stress Concerns  Occupation       Vocational Rehabilitation: Provide vocational rehab assistance to qualifying candidates.   Vocational Rehab Evaluation & Intervention: Vocational Rehab - 08/24/17 1053      Initial Vocational Rehab Evaluation & Intervention   Assessment shows need for Vocational Rehabilitation  No       Education: Education Goals: Education classes will be provided on a weekly basis, covering required topics. Participant will state understanding/return demonstration of topics presented.  Learning Barriers/Preferences: Learning Barriers/Preferences - 08/23/17 1228      Learning Barriers/Preferences   Learning Preferences  Written Material;Verbal Instruction;Skilled Demonstration       Education Topics: Count Your Pulse:  -Group instruction provided by verbal instruction, demonstration, patient participation and written materials to support subject.  Instructors address importance of being able to find your pulse and how to count your pulse when at home without a heart monitor.  Patients get hands on experience counting their pulse with staff help and individually.   CARDIAC REHAB PHASE II EXERCISE from 10/13/2017 in [place]  Date  09/03/17  Educator  Jiles Garter  Instruction Review Code  2- Demonstrated Understanding       Heart Attack, Angina, and Risk Factor Modification:  -Group instruction provided by verbal instruction, video, and written materials to support subject.  Instructors address signs and symptoms of angina and heart attacks.    Also discuss risk factors for heart disease and how to make changes to improve heart health risk factors.   CARDIAC REHAB PHASE II EXERCISE from 10/13/2017 in [place]  Date  10/13/17  Instruction Review Code  2- Demonstrated Understanding      Functional Fitness:  -Group instruction provided by verbal instruction, demonstration, patient participation, and written materials to support subject.  Instructors address safety measures for doing things around the house.  Discuss how to get up and down off the floor, how to pick things up properly, how to safely get out of a chair without assistance, and balance training.   CARDIAC REHAB PHASE II EXERCISE from 10/13/2017 in [place]  Date  09/17/17  Instruction Review Code  2- Demonstrated Understanding      Meditation and Mindfulness:  -Group instruction provided by verbal instruction, patient participation, and written materials to support subject.  Instructor addresses importance of mindfulness and meditation practice to help reduce stress and improve awareness.  Instructor also leads participants through a meditation exercise.    Stretching for Flexibility and Mobility:  -Group instruction provided by verbal instruction, patient participation, and written materials to support subject.  Instructors lead participants through series of stretches that are designed to increase flexibility thus improving mobility.  These stretches are additional exercise for major muscle groups that are  typically performed during regular warm up and cool down.   Hands Only CPR:  -Group verbal, video, and participation provides a basic overview of AHA guidelines for community  CPR. Role-play of emergencies allow participants the opportunity to practice calling for help and chest compression technique with discussion of AED use.   Hypertension: -Group verbal and written instruction that provides a basic overview of hypertension including the most recent diagnostic guidelines, risk factor reduction with self-care instructions and medication management.   CARDIAC REHAB PHASE II EXERCISE from 10/13/2017 in [place]  Date  09/24/17  Educator  RN  Instruction Review Code  2- Demonstrated Understanding       Nutrition I class: Heart Healthy Eating:  -Group instruction provided by PowerPoint slides, verbal discussion, and written materials to support subject matter. The instructor gives an explanation and review of the Therapeutic Lifestyle Changes diet recommendations, which includes a discussion on lipid goals, dietary fat, sodium, fiber, plant stanol/sterol esters, sugar, and the components of a well-balanced, healthy diet.   Nutrition II class: Lifestyle Skills:  -Group instruction provided by PowerPoint slides, verbal discussion, and written materials to support subject matter. The instructor gives an explanation and review of label reading, grocery shopping for heart health, heart healthy recipe modifications, and ways to make healthier choices when eating out.   Diabetes Question & Answer:  -Group instruction provided by PowerPoint slides, verbal discussion, and written materials to support subject matter. The instructor gives an explanation and review of diabetes co-morbidities, pre- and post-prandial blood glucose goals, pre-exercise blood glucose goals, signs, symptoms, and treatment of hypoglycemia and hyperglycemia, and foot care basics.   Diabetes Blitz:  -Group instruction provided by PowerPoint slides, verbal discussion, and written materials to support subject matter. The instructor gives an explanation and review of the  physiology behind type 1 and type 2 diabetes, diabetes medications and rational behind using different medications, pre- and post-prandial blood glucose recommendations and Hemoglobin A1c goals, diabetes diet, and exercise including blood glucose guidelines for exercising safely.    Portion Distortion:  -Group instruction provided by PowerPoint slides, verbal discussion, written materials, and food models to support subject matter. The instructor gives an explanation of serving size versus portion size, changes in portions sizes over the last 20 years, and what consists of a serving from each food group.   Stress Management:  -Group instruction provided by verbal instruction, video, and written materials to support subject matter.  Instructors review role of stress in heart disease and how to cope with stress positively.     CARDIAC REHAB PHASE II EXERCISE from 10/13/2017 in [place]  Date  09/29/17  Educator  RN  Instruction Review Code  2- Demonstrated Understanding      Exercising on Your Own:  -Group instruction provided by verbal instruction, power point, and written materials to support subject.  Instructors discuss benefits of exercise, components of exercise, frequency and intensity of exercise, and end points for exercise.  Also discuss use of nitroglycerin and activating EMS.  Review options of places to exercise outside of rehab.  Review guidelines for sex with heart disease.   CARDIAC REHAB PHASE II EXERCISE from 10/13/2017 in [place]  Date  09/01/17  Educator  EP  Instruction Review Code  2- Demonstrated Understanding      Cardiac Drugs I:  -Group instruction provided by verbal instruction and written materials to support subject.  Instructor reviews cardiac  drug classes: antiplatelets, anticoagulants, beta blockers, and statins.  Instructor discusses reasons, side effects, and lifestyle considerations for each  drug class.   CARDIAC REHAB PHASE II EXERCISE from 10/13/2017 in [place]  Date  09/15/17  Educator  Pharmacist  Instruction Review Code  2- Demonstrated Understanding      Cardiac Drugs II:  -Group instruction provided by verbal instruction and written materials to support subject.  Instructor reviews cardiac drug classes: angiotensin converting enzyme inhibitors (ACE-I), angiotensin II receptor blockers (ARBs), nitrates, and calcium channel blockers.  Instructor discusses reasons, side effects, and lifestyle considerations for each drug class.   Anatomy and Physiology of the Circulatory System:  Group verbal and written instruction and models provide basic cardiac anatomy and physiology, with the coronary electrical and arterial systems. Review of: AMI, Angina, Valve disease, Heart Failure, Peripheral Artery Disease, Cardiac Arrhythmia, Pacemakers, and the ICD.   CARDIAC REHAB PHASE II EXERCISE from 10/13/2017 in [place]  Date  10/06/17  Instruction Review Code  2- Demonstrated Understanding      Other Education:  -Group or individual verbal, written, or video instructions that support the educational goals of the cardiac rehab program.   Holiday Eating Survival Tips:  -Group instruction provided by PowerPoint slides, verbal discussion, and written materials to support subject matter. The instructor gives patients tips, tricks, and techniques to help them not only survive but enjoy the holidays despite the onslaught of food that accompanies the holidays.   Knowledge Questionnaire Score: Knowledge Questionnaire Score - 08/24/17 1036      Knowledge Questionnaire Score   Pre Score  24/24       Core Components/Risk Factors/Patient Goals at Admission: Personal Goals and Risk Factors at Admission - 08/24/17 1049      Core Components/Risk Factors/Patient Goals on Admission   Lipids  Yes    Intervention  Provide  education and support for participant on nutrition & aerobic/resistive exercise along with prescribed medications to achieve LDL 70mg , HDL >40mg .    Expected Outcomes  Short Term: Participant states understanding of desired cholesterol values and is compliant with medications prescribed. Participant is following exercise prescription and nutrition guidelines.;Long Term: Cholesterol controlled with medications as prescribed, with individualized exercise RX and with personalized nutrition plan. Value goals: LDL < 70mg , HDL > 40 mg.       Core Components/Risk Factors/Patient Goals Review:  Goals and Risk Factor Review    Row Name 08/30/17 1626 09/16/17 1420 10/14/17 1435         Core Components/Risk Factors/Patient Goals Review   Personal Goals Review  Lipids  Lipids;Stress  Lipids;Stress     Review  pt with minimal CAD RF demontrates eagerness to participate in CR program. pt personal goals are to learn exercise guidelines and learn safe exercise thresholds.  pt with minimal CAD RF demontrates eagerness to participate in CR program. pt personal goals are to learn exercise guidelines and learn safe exercise thresholds.  pt with minimal CAD RF demontrates eagerness to participate in CR program. Exavier's vital signs have been stable. Mattison has lost 1.7 kg since starting the program. Will continue to offer emotional support as needed.     Expected Outcomes  pt will participate in CR exercise, nutrition and lifestyle modification to decrease overall RF.   pt will participate in CR exercise, nutrition and lifestyle modification to decrease overall RF.   pt will participate in CR exercise, nutrition and lifestyle modification to decrease overall  RF.         Core Components/Risk Factors/Patient Goals at Discharge (Final Review):  Goals and Risk Factor Review - 10/14/17 1435      Core Components/Risk Factors/Patient Goals Review   Personal Goals Review  Lipids;Stress    Review  pt with minimal CAD RF  demontrates eagerness to participate in CR program. Dathan's vital signs have been stable. Eliberto has lost 1.7 kg since starting the program. Will continue to offer emotional support as needed.    Expected Outcomes  pt will participate in CR exercise, nutrition and lifestyle modification to decrease overall RF.        ITP Comments: ITP Comments    Row Name 08/23/17 1227 08/30/17 1625 09/16/17 1417 10/14/17 1435     ITP Comments  Dr. Fransico Him, Medical Director   pt started group exercise. pt tolerated light activity without difficulty. pt oriented to exercise equipment and safety routine.   30 Day ITP Review. Patient with good attendance and partcipation in phase 2 cardiac rehab  30 Day ITP Review. Patient with good attendance and partcipation in phase 2 cardiac rehab       Comments: See ITP comments.Barnet Pall, RN,BSN 10/14/2017 2:41 PM

## 2017-10-15 ENCOUNTER — Encounter (HOSPITAL_COMMUNITY)
Admission: RE | Admit: 2017-10-15 | Discharge: 2017-10-15 | Disposition: A | Discharge status: Home / Self Care | Payer: Managed Care, Other (non HMO) | Source: Ambulatory Visit | Attending: Cardiology | Admitting: Cardiology

## 2017-10-15 DIAGNOSIS — I213 ST elevation (STEMI) myocardial infarction of unspecified site: Secondary | ICD-10-CM

## 2017-10-15 DIAGNOSIS — Z955 Presence of coronary angioplasty implant and graft: Secondary | ICD-10-CM

## 2017-10-18 ENCOUNTER — Encounter (HOSPITAL_COMMUNITY)
Admission: RE | Admit: 2017-10-18 | Discharge: 2017-10-18 | Disposition: A | Discharge status: Home / Self Care | Payer: Managed Care, Other (non HMO) | Source: Ambulatory Visit | Attending: Cardiology | Admitting: Cardiology

## 2017-10-18 DIAGNOSIS — I213 ST elevation (STEMI) myocardial infarction of unspecified site: Secondary | ICD-10-CM | POA: Diagnosis not present

## 2017-10-18 DIAGNOSIS — Z955 Presence of coronary angioplasty implant and graft: Secondary | ICD-10-CM

## 2017-10-20 ENCOUNTER — Encounter (HOSPITAL_COMMUNITY)
Admission: RE | Admit: 2017-10-20 | Discharge: 2017-10-20 | Disposition: A | Discharge status: Home / Self Care | Payer: Managed Care, Other (non HMO) | Source: Ambulatory Visit | Attending: Cardiology | Admitting: Cardiology

## 2017-10-20 ENCOUNTER — Ambulatory Visit: Payer: Managed Care, Other (non HMO) | Admitting: Cardiology

## 2017-10-20 DIAGNOSIS — I213 ST elevation (STEMI) myocardial infarction of unspecified site: Secondary | ICD-10-CM | POA: Diagnosis not present

## 2017-10-20 DIAGNOSIS — Z955 Presence of coronary angioplasty implant and graft: Secondary | ICD-10-CM

## 2017-10-22 ENCOUNTER — Encounter (HOSPITAL_COMMUNITY)
Admission: RE | Admit: 2017-10-22 | Discharge: 2017-10-22 | Disposition: A | Discharge status: Home / Self Care | Payer: Managed Care, Other (non HMO) | Source: Ambulatory Visit | Attending: Cardiology | Admitting: Cardiology

## 2017-10-22 DIAGNOSIS — I213 ST elevation (STEMI) myocardial infarction of unspecified site: Secondary | ICD-10-CM | POA: Diagnosis not present

## 2017-10-22 DIAGNOSIS — Z955 Presence of coronary angioplasty implant and graft: Secondary | ICD-10-CM

## 2017-10-25 ENCOUNTER — Encounter (HOSPITAL_COMMUNITY)
Admission: RE | Admit: 2017-10-25 | Discharge: 2017-10-25 | Disposition: A | Discharge status: Home / Self Care | Payer: Managed Care, Other (non HMO) | Source: Ambulatory Visit | Attending: Cardiology | Admitting: Cardiology

## 2017-10-25 DIAGNOSIS — I213 ST elevation (STEMI) myocardial infarction of unspecified site: Secondary | ICD-10-CM | POA: Diagnosis not present

## 2017-10-25 DIAGNOSIS — Z955 Presence of coronary angioplasty implant and graft: Secondary | ICD-10-CM

## 2017-10-27 ENCOUNTER — Encounter (HOSPITAL_COMMUNITY)
Admission: RE | Admit: 2017-10-27 | Discharge: 2017-10-27 | Disposition: A | Discharge status: Home / Self Care | Payer: Managed Care, Other (non HMO) | Source: Ambulatory Visit | Attending: Cardiology | Admitting: Cardiology

## 2017-10-27 DIAGNOSIS — I213 ST elevation (STEMI) myocardial infarction of unspecified site: Secondary | ICD-10-CM | POA: Diagnosis not present

## 2017-10-27 DIAGNOSIS — Z955 Presence of coronary angioplasty implant and graft: Secondary | ICD-10-CM

## 2017-10-27 NOTE — Progress Notes (Signed)
Cardiology Office Note   Date:  10/28/2017   ID:  Casey Cummings, DOB [birth year], MRN [id number]  PCP:  Francesca Oman, DO  Cardiologist:   Minus Breeding, MD   Chief Complaint  Patient presents with  . Coronary Artery Disease      History of Present Illness: Casey Cummings is a 62 y.o. male who presents for follow up of NSTEM in May.  He had a DES to the LAD.  Since I last saw him he has had some fleeting chest discomfort that he had immediately after his stent but this is gone away and he is been doing cardiac rehab and doing well with this.  He has not been getting any symptoms similar to his previous angina.  He has had some palpitations these have been mild and they seem to gone away in the last week or so.  He is been able to do activities in is not been getting the same symptoms that he had prior and denies neck or arm discomfort.  He is had no new shortness of breath, PND or orthopnea.   Past Medical History:  Diagnosis Date  . CAD (coronary artery disease)    a. 07/2017 s/p Ant STEMI/PCI: initially admitted w/ chest pain->Cardiac CTA: Ca2+ 0, >70%LAD stenosis->pt later dev Ant STEMI->Cath: LM nl, LAD 90p (3.5x16 Synergy DES), 25m, LCX nl, RCA nl.  . Diastolic dysfunction    a. 07/2017 Echo: EF 65-70%, mild LVH, Gr1 DD.  Marland Kitchen Dyslipidemia, goal LDL below 70   . Pulmonary nodules    a. 06/2017 Chest CT [facility]): biapical airspace dzs w/ more nodular focus in RUL w/ some associated bronchiectasis and prominent pleural-parenchymal scarring (seen by Pulm - similar findings on CT neck in 01/2016).    Past Surgical History:  Procedure Laterality Date  . CORONARY/GRAFT ACUTE MI REVASCULARIZATION N/A 07/08/2017   Procedure: CORONARY/GRAFT ACUTE MI REVASCULARIZATION;  Surgeon: Leonie Man, MD;  Location: [place] [facility];  Service: Cardiovascular;  Laterality: N/A;  . LEFT HEART CATH AND CORONARY ANGIOGRAPHY N/A 07/08/2017   Procedure: LEFT HEART CATH AND CORONARY ANGIOGRAPHY;   Surgeon: Leonie Man, MD;  Location: [place] [facility];  Service: Cardiovascular;  Laterality: N/A;     Current Outpatient Medications  Medication Sig Dispense Refill  . aspirin 81 MG chewable tablet Chew 1 tablet (81 mg total) by mouth daily. 90 tablet 3  . atorvastatin (LIPITOR) 80 MG tablet Take 1 tablet (80 mg total) by mouth daily at 6 PM. 90 tablet 3  . metoprolol succinate (TOPROL XL) 25 MG 24 hr tablet Take 0.5 tablets (12.5 mg total) by mouth daily. 45 tablet 1  . Multiple Vitamin (MULTIVITAMIN) tablet Take 1 tablet by mouth daily.    . nitroGLYCERIN (NITROSTAT) 0.4 MG SL tablet Place 1 tablet (0.4 mg total) under the tongue every 5 (five) minutes as needed for chest pain. 25 tablet 3  . ticagrelor (BRILINTA) 90 MG TABS tablet Take 1 tablet (90 mg total) by mouth 2 (two) times daily. 180 tablet 3   No current facility-administered medications for this visit.     Allergies:   Patient has no known allergies.     ROS:  Please see the history of present illness.   Otherwise, review of systems are positive for none.   All other systems are reviewed and negative.    PHYSICAL EXAM: VS:  BP 128/72 (BP Location: Left Arm, Patient Position: Sitting)   Pulse 63  Ht 5\' 10"  (1.778 m)   Wt 164 lb 12.8 oz (74.8 kg)   SpO2 97%   BMI 23.65 kg/m  , BMI Body mass index is 23.65 kg/m. GENERAL:  Well appearing NECK:  No jugular venous distention, waveform within normal limits, carotid upstroke brisk and symmetric, no bruits, no thyromegaly LUNGS:  Clear to auscultation bilaterally CHEST:  Unremarkable HEART:  PMI not displaced or sustained,S1 and S2 within normal limits, no S3, no S4, no clicks, no rubs, no murmurs ABD:  Flat, positive bowel sounds normal in frequency in pitch, no bruits, no rebound, no guarding, no midline pulsatile mass, no hepatomegaly, no splenomegaly EXT:  2 plus pulses throughout, no edema, no cyanosis no clubbing    EKG:  EKG is not ordered  today.   Recent Labs: 07/08/2017: Magnesium 2.0; TSH 3.323 08/06/2017: ALT 30 08/14/2017: BUN 17; Creatinine, Ser 1.04; Hemoglobin 14.3; Platelets 252; Potassium 3.5; Sodium 137    Lipid Panel    Component Value Date/Time   CHOL 92 (L) 08/06/2017 0808   TRIG 86 08/06/2017 0808   HDL 41 08/06/2017 0808   CHOLHDL 2.2 08/06/2017 0808   CHOLHDL 3.6 07/08/2017 1244   VLDL 25 07/08/2017 1244   LDLCALC 34 08/06/2017 0808      Wt Readings from Last 3 Encounters:  10/28/17 164 lb 12.8 oz (74.8 kg)  09/06/17 164 lb 10.9 oz (74.7 kg)  08/24/17 166 lb 3.6 oz (75.4 kg)      Other studies Reviewed: Additional studies/ records that were reviewed today include: None. Review of the above records demonstrates:  Please see elsewhere in the note.     ASSESSMENT AND PLAN:  CAD:  The patient has no new sypmtoms.  No further cardiovascular testing is indicated.  We will continue with aggressive risk reduction and meds as listed.    LUNG NODULES:  This is followed by Dr. Lamonte Sakai  DYSLIPIDEMIA : His lipids were actually low in the hospital before the Lipitor was started.  I am to repeat this and will actually likely be able to reduce him down to a moderate dose of statin.  I doubt that he needs 80 mg.  Current medicines are reviewed at length with the patient today.  The patient does not have concerns regarding medicines.  The following changes have been made:  no change  Labs/ tests ordered today include:   Orders Placed This Encounter  Procedures  . Lipid panel  . Hepatic function panel     Disposition:   FU with me in May.     Signed, Minus Breeding, MD  10/28/2017 5:51 PM    Reader

## 2017-10-28 ENCOUNTER — Encounter: Payer: Self-pay | Admitting: Cardiology

## 2017-10-28 ENCOUNTER — Ambulatory Visit (INDEPENDENT_AMBULATORY_CARE_PROVIDER_SITE_OTHER): Payer: Managed Care, Other (non HMO) | Admitting: Cardiology

## 2017-10-28 VITALS — BP 128/72 | HR 63 | Ht 70.0 in | Wt 164.8 lb

## 2017-10-28 DIAGNOSIS — I2510 Atherosclerotic heart disease of native coronary artery without angina pectoris: Secondary | ICD-10-CM | POA: Diagnosis not present

## 2017-10-28 DIAGNOSIS — E785 Hyperlipidemia, unspecified: Secondary | ICD-10-CM | POA: Diagnosis not present

## 2017-10-28 DIAGNOSIS — Z79899 Other long term (current) drug therapy: Secondary | ICD-10-CM

## 2017-10-28 NOTE — Patient Instructions (Addendum)
Medication Instructions:  Continue current medications  If you need a refill on your cardiac medications before your next appointment, please call your pharmacy.  Labwork: Fasting Lipid and Liver  Testing/Procedures: None Ordered   Follow-Up: Your physician wants you to follow-up in: May 2020. You should receive a reminder letter in the mail two months in advance. If you do not receive a letter, please call our office in [phone] to schedule your follow-up appointment.      Thank you for choosing [facility]!!

## 2017-10-29 ENCOUNTER — Encounter (HOSPITAL_COMMUNITY)
Admission: RE | Admit: 2017-10-29 | Discharge: 2017-10-29 | Disposition: A | Discharge status: Home / Self Care | Payer: Managed Care, Other (non HMO) | Source: Ambulatory Visit | Attending: Cardiology | Admitting: Cardiology

## 2017-10-29 DIAGNOSIS — I213 ST elevation (STEMI) myocardial infarction of unspecified site: Secondary | ICD-10-CM

## 2017-10-29 DIAGNOSIS — Z955 Presence of coronary angioplasty implant and graft: Secondary | ICD-10-CM

## 2017-11-01 ENCOUNTER — Encounter (HOSPITAL_COMMUNITY)
Admission: RE | Admit: 2017-11-01 | Discharge: 2017-11-01 | Disposition: A | Discharge status: Home / Self Care | Payer: Managed Care, Other (non HMO) | Source: Ambulatory Visit | Attending: Cardiology | Admitting: Cardiology

## 2017-11-01 DIAGNOSIS — I213 ST elevation (STEMI) myocardial infarction of unspecified site: Secondary | ICD-10-CM

## 2017-11-01 DIAGNOSIS — Z955 Presence of coronary angioplasty implant and graft: Secondary | ICD-10-CM

## 2017-11-03 ENCOUNTER — Encounter (HOSPITAL_COMMUNITY)
Admission: RE | Admit: 2017-11-03 | Discharge: 2017-11-03 | Disposition: A | Discharge status: Home / Self Care | Payer: Managed Care, Other (non HMO) | Source: Ambulatory Visit | Attending: Cardiology | Admitting: Cardiology

## 2017-11-03 DIAGNOSIS — Z955 Presence of coronary angioplasty implant and graft: Secondary | ICD-10-CM

## 2017-11-03 DIAGNOSIS — I213 ST elevation (STEMI) myocardial infarction of unspecified site: Secondary | ICD-10-CM | POA: Diagnosis not present

## 2017-11-05 ENCOUNTER — Encounter (HOSPITAL_COMMUNITY)
Admission: RE | Admit: 2017-11-05 | Discharge: 2017-11-05 | Disposition: A | Discharge status: Home / Self Care | Payer: Managed Care, Other (non HMO) | Source: Ambulatory Visit | Attending: Cardiology | Admitting: Cardiology

## 2017-11-05 DIAGNOSIS — I213 ST elevation (STEMI) myocardial infarction of unspecified site: Secondary | ICD-10-CM | POA: Diagnosis not present

## 2017-11-05 DIAGNOSIS — Z955 Presence of coronary angioplasty implant and graft: Secondary | ICD-10-CM

## 2017-11-10 ENCOUNTER — Encounter (HOSPITAL_COMMUNITY)
Admission: RE | Admit: 2017-11-10 | Discharge: 2017-11-10 | Disposition: A | Discharge status: Home / Self Care | Payer: Managed Care, Other (non HMO) | Source: Ambulatory Visit | Attending: Cardiology | Admitting: Cardiology

## 2017-11-10 DIAGNOSIS — Z79899 Other long term (current) drug therapy: Secondary | ICD-10-CM | POA: Insufficient documentation

## 2017-11-10 DIAGNOSIS — E785 Hyperlipidemia, unspecified: Secondary | ICD-10-CM | POA: Diagnosis not present

## 2017-11-10 DIAGNOSIS — I213 ST elevation (STEMI) myocardial infarction of unspecified site: Secondary | ICD-10-CM | POA: Diagnosis present

## 2017-11-10 DIAGNOSIS — I2510 Atherosclerotic heart disease of native coronary artery without angina pectoris: Secondary | ICD-10-CM | POA: Insufficient documentation

## 2017-11-10 DIAGNOSIS — Z955 Presence of coronary angioplasty implant and graft: Secondary | ICD-10-CM | POA: Insufficient documentation

## 2017-11-10 DIAGNOSIS — Z7982 Long term (current) use of aspirin: Secondary | ICD-10-CM | POA: Insufficient documentation

## 2017-11-10 NOTE — Progress Notes (Signed)
Cardiac Individual Treatment Plan  Patient Details  Name: Casey Cummings MRN: [id number] Date of Birth: [birth year] Referring Provider:     CARDIAC REHAB PHASE II ORIENTATION from 08/24/2017 in [place]  Referring Provider  Minus Breeding, MD.      Initial Encounter Date:    CARDIAC REHAB PHASE II ORIENTATION from 08/24/2017 in [place]  Date  08/24/17      Visit Diagnosis: Stented coronary artery  ST elevation myocardial infarction (STEMI), unspecified artery ([place])  Patient's Home Medications on Admission:  Current Outpatient Medications:  .  aspirin 81 MG chewable tablet, Chew 1 tablet (81 mg total) by mouth daily., Disp: 90 tablet, Rfl: 3 .  atorvastatin (LIPITOR) 80 MG tablet, Take 1 tablet (80 mg total) by mouth daily at 6 PM., Disp: 90 tablet, Rfl: 3 .  metoprolol succinate (TOPROL XL) 25 MG 24 hr tablet, Take 0.5 tablets (12.5 mg total) by mouth daily., Disp: 45 tablet, Rfl: 1 .  Multiple Vitamin (MULTIVITAMIN) tablet, Take 1 tablet by mouth daily., Disp: , Rfl:  .  nitroGLYCERIN (NITROSTAT) 0.4 MG SL tablet, Place 1 tablet (0.4 mg total) under the tongue every 5 (five) minutes as needed for chest pain., Disp: 25 tablet, Rfl: 3 .  ticagrelor (BRILINTA) 90 MG TABS tablet, Take 1 tablet (90 mg total) by mouth 2 (two) times daily., Disp: 180 tablet, Rfl: 3  Past Medical History: Past Medical History:  Diagnosis Date  . CAD (coronary artery disease)    a. 07/2017 s/p Ant STEMI/PCI: initially admitted w/ chest pain->Cardiac CTA: Ca2+ 0, >70%LAD stenosis->pt later dev Ant STEMI->Cath: LM nl, LAD 90p (3.5x16 Synergy DES), 79m, LCX nl, RCA nl.  . Diastolic dysfunction    a. 07/2017 Echo: EF 65-70%, mild LVH, Gr1 DD.  Marland Kitchen Dyslipidemia, goal LDL below 70   . Pulmonary nodules    a. 06/2017 Chest CT [facility]): biapical airspace dzs w/ more nodular focus in RUL w/ some associated bronchiectasis and prominent  pleural-parenchymal scarring (seen by Pulm - similar findings on CT neck in 01/2016).    Tobacco Use: Social History   Tobacco Use  Smoking Status Never Smoker  Smokeless Tobacco Never Used    Labs: Recent Review Scientist, physiological    Labs for ITP Cardiac and Pulmonary Rehab Latest Ref Rng & Units 07/08/2017 08/06/2017   Cholestrol 100 - 199 mg/dL 175 92(L)   LDLCALC 0 - 99 mg/dL 102(H) 34   HDL >39 mg/dL 48 41   Trlycerides 0 - 149 mg/dL 126 86   Hemoglobin A1c 4.8 - 5.6 % 5.6 -      Capillary Blood Glucose: Lab Results  Component Value Date   GLUCAP 124 (H) 08/14/2017     Exercise Target Goals: Exercise Program Goal: Individual exercise prescription set using results from initial 6 min walk test and THRR while considering  patient's activity barriers and safety.   Exercise Prescription Goal: Initial exercise prescription builds to 30-45 minutes a day of aerobic activity, 2-3 days per week.  Home exercise guidelines will be given to patient during program as part of exercise prescription that the participant will acknowledge.  Activity Barriers & Risk Stratification: Activity Barriers & Cardiac Risk Stratification - 08/24/17 0850      Activity Barriers & Cardiac Risk Stratification   Activity Barriers  Other (comment)    Comments  Vertigo    Cardiac Risk Stratification  High       6 Minute  Walk: 6 Minute Walk    Row Name 08/24/17 0848         6 Minute Walk   Phase  Initial     Distance  1600 feet     Walk Time  6 minutes     # of Rest Breaks  0     MPH  3.03     METS  4.09     RPE  13     VO2 Peak  14.32     Symptoms  No     Resting HR  66 bpm     Resting BP  124/80     Resting Oxygen Saturation   97 %     Exercise Oxygen Saturation  during 6 min walk  99 %     Max Ex. HR  96 bpm     Max Ex. BP  122/78     2 Minute Post BP  108/68        Oxygen Initial Assessment:   Oxygen Re-Evaluation:   Oxygen Discharge (Final Oxygen  Re-Evaluation):   Initial Exercise Prescription: Initial Exercise Prescription - 08/24/17 1000      Date of Initial Exercise RX and Referring Provider   Date  08/24/17    Referring Provider  Minus Breeding, MD.      Bike   Level  1.2    Minutes  10    METs  4.02      NuStep   Level  4    SPM  85    Minutes  10    METs  3.1      Track   Laps  12    Minutes  10    METs  3.09      Prescription Details   Frequency (times per week)  3    Duration  Progress to 30 minutes of continuous aerobic without signs/symptoms of physical distress      Intensity   THRR 40-80% of Max Heartrate  64-127    Ratings of Perceived Exertion  11-13    Perceived Dyspnea  0-4      Progression   Progression  Continue to progress workloads to maintain intensity without signs/symptoms of physical distress.      Resistance Training   Training Prescription  Yes    Weight  4lbs    Reps  10-15       Perform Capillary Blood Glucose checks as needed.  Exercise Prescription Changes: Exercise Prescription Changes    Row Name 08/30/17 1449 09/06/17 1446 09/20/17 1447 10/04/17 1447 10/18/17 1449     Response to Exercise   Blood Pressure (Admit)  120/74  108/66  112/74  108/70  98/68   Blood Pressure (Exercise)  148/80  142/80  142/80  128/72  132/70   Blood Pressure (Exit)  106/70  118/82  106/76  104/60  110/64   Heart Rate (Admit)  72 bpm  68 bpm  72 bpm  81 bpm  67 bpm   Heart Rate (Exercise)  110 bpm  129 bpm  130 bpm  126 bpm  132 bpm   Heart Rate (Exit)  71 bpm  68 bpm  72 bpm  81 bpm  71 bpm   Rating of Perceived Exertion (Exercise)  13  13  14  15  15    Symptoms  none  none  none  none  none   Duration  Continue with 30 min of aerobic exercise without signs/symptoms of physical distress.  Continue with 30 min of aerobic exercise without signs/symptoms of physical distress.  Continue with 30 min of aerobic exercise without signs/symptoms of physical distress.  Continue with 30 min of  aerobic exercise without signs/symptoms of physical distress.  Continue with 30 min of aerobic exercise without signs/symptoms of physical distress.   Intensity  THRR unchanged  THRR unchanged  THRR New up to 151 bpm  THRR unchanged  THRR unchanged     Progression   Progression  Continue to progress workloads to maintain intensity without signs/symptoms of physical distress.  Continue to progress workloads to maintain intensity without signs/symptoms of physical distress.  Continue to progress workloads to maintain intensity without signs/symptoms of physical distress.  Continue to progress workloads to maintain intensity without signs/symptoms of physical distress.  Continue to progress workloads to maintain intensity without signs/symptoms of physical distress.   Average METs  3.5  5.1  6.4  6.7  7.3     Resistance Training   Training Prescription  Yes  No  Yes  Yes  Yes   Weight  4lbs  -  5lbs  5lbs  5lbs   Reps  10-15  -  10-15  10-15  10-15   Time  10 Minutes  -  10 Minutes  10 Minutes  10 Minutes     Interval Training   Interval Training  No  No  No  No  No     Bike   Level  1.2  2.5  2.3  2.3  2.4   Minutes  10  10  10  10  10    METs  4  7.31  6.8  6.89  7.1     NuStep   Level  4  5  5  6  6    SPM  85  85  85  85  85   Minutes  10  10  10  10  10    METs  3.2  5.3  6.1  6.2  7.3     Rower   Level  -  -  5  5  6    Watts  -  -  60  75  86   Minutes  -  -  10  10  10    METs  -  -  6.2  6.9  7.4     Track   Laps  14  10  -  -  -   Minutes  10  10  -  -  -   METs  3.43  2.74  -  -  -     Home Exercise Plan   Plans to continue exercise at  -  Home (comment)  Home (comment)  Home (comment)  Home (comment)   Frequency  -  Add 2 additional days to program exercise sessions.  Add 2 additional days to program exercise sessions.  Add 2 additional days to program exercise sessions.  Add 2 additional days to program exercise sessions.   Initial Home Exercises Provided  -  09/06/17   09/06/17  09/06/17  09/06/17   Row Name 11/01/17 1448             Response to Exercise   Blood Pressure (Admit)  102/64       Blood Pressure (Exercise)  124/60       Blood Pressure (Exit)  104/78       Heart Rate (Admit)  68 bpm  Heart Rate (Exercise)  128 bpm       Heart Rate (Exit)  64 bpm       Rating of Perceived Exertion (Exercise)  15       Symptoms  none       Duration  Continue with 30 min of aerobic exercise without signs/symptoms of physical distress.       Intensity  THRR unchanged         Progression   Progression  Continue to progress workloads to maintain intensity without signs/symptoms of physical distress.       Average METs  7.5         Resistance Training   Training Prescription  Yes       Weight  5lbs       Reps  10-15       Time  10 Minutes         Interval Training   Interval Training  No         Bike   Level  2.4       Minutes  10       METs  7.14         NuStep   Level  7       SPM  85       Minutes  10       METs  7.2         Rower   Level  6       Watts  105       Minutes  10       METs  8.2         Home Exercise Plan   Plans to continue exercise at  Home (comment)       Frequency  Add 2 additional days to program exercise sessions.       Initial Home Exercises Provided  09/06/17          Exercise Comments: Exercise Comments    Row Name 08/30/17 1547 09/06/17 1535 09/20/17 1516 10/04/17 1501 10/18/17 1514   Exercise Comments  Patient tolerated first session of exercise well without c/o.  Reviewed home exercise guidelines, METs and goals with patient.  Reviewed METs with patient. Received clearance to increase THRR to 151 bpm.  Reviewed METs and goals with patient.   Reviewed METs with patient.    [place] Name 11/01/17 1530           Exercise Comments  Reviewed METs and goals with patient.           Exercise Goals and Review: Exercise Goals    Row Name 08/24/17 0848             Exercise Goals   Increase Physical  Activity  Yes       Intervention  Provide advice, education, support and counseling about physical activity/exercise needs.;Develop an individualized exercise prescription for aerobic and resistive training based on initial evaluation findings, risk stratification, comorbidities and participant's personal goals.       Expected Outcomes  Short Term: Attend rehab on a regular basis to increase amount of physical activity.;Long Term: Exercising regularly at least 3-5 days a week.;Long Term: Add in home exercise to make exercise part of routine and to increase amount of physical activity.       Increase Strength and Stamina  Yes       Intervention  Provide advice, education, support and counseling about physical activity/exercise needs.;Develop an individualized exercise prescription  for aerobic and resistive training based on initial evaluation findings, risk stratification, comorbidities and participant's personal goals.       Expected Outcomes  Short Term: Increase workloads from initial exercise prescription for resistance, speed, and METs.;Short Term: Perform resistance training exercises routinely during rehab and add in resistance training at home;Long Term: Improve cardiorespiratory fitness, muscular endurance and strength as measured by increased METs and functional capacity (6MWT)       Able to understand and use rate of perceived exertion (RPE) scale  Yes       Intervention  Provide education and explanation on how to use RPE scale       Expected Outcomes  Short Term: Able to use RPE daily in rehab to express subjective intensity level;Long Term:  Able to use RPE to guide intensity level when exercising independently       Knowledge and understanding of Target Heart Rate Range (THRR)  Yes       Intervention  Provide education and explanation of THRR including how the numbers were predicted and where they are located for reference       Expected Outcomes  Short Term: Able to state/look up THRR;Long  Term: Able to use THRR to govern intensity when exercising independently;Short Term: Able to use daily as guideline for intensity in rehab       Able to check pulse independently  Yes       Intervention  Provide education and demonstration on how to check pulse in carotid and radial arteries.;Review the importance of being able to check your own pulse for safety during independent exercise       Expected Outcomes  Short Term: Able to explain why pulse checking is important during independent exercise;Long Term: Able to check pulse independently and accurately       Understanding of Exercise Prescription  Yes       Intervention  Provide education, explanation, and written materials on patient's individual exercise prescription       Expected Outcomes  Short Term: Able to explain program exercise prescription;Long Term: Able to explain home exercise prescription to exercise independently          Exercise Goals Re-Evaluation : Exercise Goals Re-Evaluation    Row Name 08/30/17 1547 09/06/17 1535 10/04/17 1501 11/01/17 1530       Exercise Goal Re-Evaluation   Exercise Goals Review  Able to understand and use rate of perceived exertion (RPE) scale  Able to understand and use rate of perceived exertion (RPE) scale;Understanding of Exercise Prescription;Knowledge and understanding of Target Heart Rate Range (THRR)  -  Understanding of Exercise Prescription    Comments  Patient is able to understand and use RPE scale appropriately.  Reviewed home exercise guidelines with patient including THRR, RPE scale and endpoints for exercise. Pt states he's walking 30 minutes on Tuesdays and Thursdays.  Patient continues to progress well with exercise achieving 6.7 METs with exercise.  Patient is making excellent progress with exercise and cardiac rehab and walking at least 4 days/week at home. Pt returned to the gym for the first time without any problems.    Expected Outcomes  Increase workloads as tolerated to  help achieve personal health and fitness goals.  Patient will walk 30 minutes, 2 days/week in addition to exercise at cardiac rehab.  Continue walking at home in addition to progressing workloads at CR to help achieve personal health and fitness goals.  Patient will continue current exercise routine walking and now exercising at  the gym in addition to exercise at cardiac rehab.       Discharge Exercise Prescription (Final Exercise Prescription Changes): Exercise Prescription Changes - 11/01/17 1448      Response to Exercise   Blood Pressure (Admit)  102/64    Blood Pressure (Exercise)  124/60    Blood Pressure (Exit)  104/78    Heart Rate (Admit)  68 bpm    Heart Rate (Exercise)  128 bpm    Heart Rate (Exit)  64 bpm    Rating of Perceived Exertion (Exercise)  15    Symptoms  none    Duration  Continue with 30 min of aerobic exercise without signs/symptoms of physical distress.    Intensity  THRR unchanged      Progression   Progression  Continue to progress workloads to maintain intensity without signs/symptoms of physical distress.    Average METs  7.5      Resistance Training   Training Prescription  Yes    Weight  5lbs    Reps  10-15    Time  10 Minutes      Interval Training   Interval Training  No      Bike   Level  2.4    Minutes  10    METs  7.14      NuStep   Level  7    SPM  85    Minutes  10    METs  7.2      Rower   Level  6    Watts  105    Minutes  10    METs  8.2      Home Exercise Plan   Plans to continue exercise at  Home (comment)    Frequency  Add 2 additional days to program exercise sessions.    Initial Home Exercises Provided  09/06/17       Nutrition:  Target Goals: Understanding of nutrition guidelines, daily intake of sodium 1500mg , cholesterol 200mg , calories 30% from fat and 7% or less from saturated fats, daily to have 5 or more servings of fruits and vegetables.  Biometrics: Pre Biometrics - 08/24/17 1100      Pre Biometrics    Height  5\' 10"  (1.778 m)    Weight  75.4 kg    Waist Circumference  34.75 inches    Hip Circumference  38.5 inches    Waist to Hip Ratio  0.9 %    BMI (Calculated)  23.85    Triceps Skinfold  13 mm    % Body Fat  22.7 %    Grip Strength  42.5 kg    Flexibility  9.5 in    Single Leg Stand  11.6 seconds        Nutrition Therapy Plan and Nutrition Goals: Nutrition Therapy & Goals - 08/24/17 1152      Nutrition Therapy   Diet  heart healthy      Intervention Plan   Intervention  Prescribe, educate and counsel regarding individualized specific dietary modifications aiming towards targeted core components such as weight, hypertension, lipid management, diabetes, heart failure and other comorbidities.;Nutrition handout(s) given to patient.    Expected Outcomes  Short Term Goal: Understand basic principles of dietary content, such as calories, fat, sodium, cholesterol and nutrients.;Long Term Goal: Adherence to prescribed nutrition plan.       Nutrition Assessments: Nutrition Assessments - 08/24/17 1154      MEDFICTS Scores   Pre Score  12  Nutrition Goals Re-Evaluation:   Nutrition Goals Re-Evaluation:   Nutrition Goals Discharge (Final Nutrition Goals Re-Evaluation):   Psychosocial: Target Goals: Acknowledge presence or absence of significant depression and/or stress, maximize coping skills, provide positive support system. Participant is able to verbalize types and ability to use techniques and skills needed for reducing stress and depression.  Initial Review & Psychosocial Screening: Initial Psych Review & Screening - 08/24/17 1053      Initial Review   Current issues with  None Identified      Family Dynamics   Good Support System?  Yes   wife, children, friends      Barriers   Psychosocial barriers to participate in program  There are no identifiable barriers or psychosocial needs.      Screening Interventions   Interventions  Encouraged to exercise        Quality of Life Scores: Quality of Life - 08/24/17 1054      Quality of Life Scores   Health/Function Pre  20.7 %    Socioeconomic Pre  26.56 %    Psych/Spiritual Pre  24.79 %    Family Pre  28.3 %    GLOBAL Pre  23.96 %      Scores of 19 and below usually indicate a poorer quality of life in these areas.  A difference of  2-3 points is a clinically meaningful difference.  A difference of 2-3 points in the total score of the Quality of Life Index has been associated with significant improvement in overall quality of life, self-image, physical symptoms, and general health in studies assessing change in quality of life.  PHQ-9: Recent Review Flowsheet Data    Depression screen [facility] 2/9 08/30/2017   Decreased Interest 0   Down, Depressed, Hopeless 0   PHQ - 2 Score 0     Interpretation of Total Score  Total Score Depression Severity:  1-4 = Minimal depression, 5-9 = Mild depression, 10-14 = Moderate depression, 15-19 = Moderately severe depression, 20-27 = Severe depression   Psychosocial Evaluation and Intervention: Psychosocial Evaluation - 08/30/17 1628      Psychosocial Evaluation & Interventions   Interventions  Encouraged to exercise with the program and follow exercise prescription    Comments  no psychosocial needs identified, no interventions necessary.  pt enjoys fishing and is eagerly anticipating resuming this activity.    Expected Outcomes  pt will exhibit positive outlook with good coping skills.     Continue Psychosocial Services   No Follow up required       Psychosocial Re-Evaluation: Psychosocial Re-Evaluation    [place] Name 09/16/17 1422 09/16/17 1423 10/14/17 1439 11/10/17 1650       Psychosocial Re-Evaluation   Current issues with  Current Stress Concerns  -  Current Stress Concerns  Current Stress Concerns    Comments  Robbie has a highly stressful job. Will continue to offer support as needed  -  Alyan has a highly stressful job. Will continue to  offer support as needed  Dougles has a highly stressful job. Will continue to offer support as needed    Interventions  Stress management education;Encouraged to attend Cardiac Rehabilitation for the exercise  -  Stress management education;Encouraged to attend Cardiac Rehabilitation for the exercise  Stress management education;Encouraged to attend Cardiac Rehabilitation for the exercise    Continue Psychosocial Services   -  Follow up required by staff  Follow up required by staff  Follow up required by staff  Comments  -  -  -  Sholom recently saw his cardiologist and reports feeling much better and denies feeling as anxious as he had      Initial Review   Source of Stress Concerns  Occupation  -  Occupation  Occupation       Psychosocial Discharge (Final Psychosocial Re-Evaluation): Psychosocial Re-Evaluation - 11/10/17 1650      Psychosocial Re-Evaluation   Current issues with  Current Stress Concerns    Comments  Trypp has a highly stressful job. Will continue to offer support as needed    Interventions  Stress management education;Encouraged to attend Cardiac Rehabilitation for the exercise    Continue Psychosocial Services   Follow up required by staff    Comments  Shanon Brow recently saw his cardiologist and reports feeling much better and denies feeling as anxious as he had      Initial Review   Source of Stress Concerns  Occupation       Vocational Rehabilitation: Provide vocational rehab assistance to qualifying candidates.   Vocational Rehab Evaluation & Intervention: Vocational Rehab - 08/24/17 1053      Initial Vocational Rehab Evaluation & Intervention   Assessment shows need for Vocational Rehabilitation  No       Education: Education Goals: Education classes will be provided on a weekly basis, covering required topics. Participant will state understanding/return demonstration of topics presented.  Learning Barriers/Preferences: Learning Barriers/Preferences -  08/23/17 1228      Learning Barriers/Preferences   Learning Preferences  Written Material;Verbal Instruction;Skilled Demonstration       Education Topics: Count Your Pulse:  -Group instruction provided by verbal instruction, demonstration, patient participation and written materials to support subject.  Instructors address importance of being able to find your pulse and how to count your pulse when at home without a heart monitor.  Patients get hands on experience counting their pulse with staff help and individually.   CARDIAC REHAB PHASE II EXERCISE from 10/20/2017 in [place]  Date  10/15/17  Educator  Jiles Garter  Instruction Review Code  2- Demonstrated Understanding      Heart Attack, Angina, and Risk Factor Modification:  -Group instruction provided by verbal instruction, video, and written materials to support subject.  Instructors address signs and symptoms of angina and heart attacks.    Also discuss risk factors for heart disease and how to make changes to improve heart health risk factors.   CARDIAC REHAB PHASE II EXERCISE from 10/20/2017 in [place]  Date  10/13/17  Instruction Review Code  2- Demonstrated Understanding      Functional Fitness:  -Group instruction provided by verbal instruction, demonstration, patient participation, and written materials to support subject.  Instructors address safety measures for doing things around the house.  Discuss how to get up and down off the floor, how to pick things up properly, how to safely get out of a chair without assistance, and balance training.   CARDIAC REHAB PHASE II EXERCISE from 10/20/2017 in [place]  Date  09/17/17  Instruction Review Code  2- Demonstrated Understanding      Meditation and Mindfulness:  -Group instruction provided by verbal instruction, patient participation, and written materials to support  subject.  Instructor addresses importance of mindfulness and meditation practice to help reduce stress and improve awareness.  Instructor also leads participants through a meditation exercise.    Stretching for Flexibility and Mobility:  -Group  instruction provided by verbal instruction, patient participation, and written materials to support subject.  Instructors lead participants through series of stretches that are designed to increase flexibility thus improving mobility.  These stretches are additional exercise for major muscle groups that are typically performed during regular warm up and cool down.   Hands Only CPR:  -Group verbal, video, and participation provides a basic overview of AHA guidelines for community CPR. Role-play of emergencies allow participants the opportunity to practice calling for help and chest compression technique with discussion of AED use.   Hypertension: -Group verbal and written instruction that provides a basic overview of hypertension including the most recent diagnostic guidelines, risk factor reduction with self-care instructions and medication management.   CARDIAC REHAB PHASE II EXERCISE from 10/20/2017 in [place]  Date  09/24/17  Educator  RN  Instruction Review Code  2- Demonstrated Understanding       Nutrition I class: Heart Healthy Eating:  -Group instruction provided by PowerPoint slides, verbal discussion, and written materials to support subject matter. The instructor gives an explanation and review of the Therapeutic Lifestyle Changes diet recommendations, which includes a discussion on lipid goals, dietary fat, sodium, fiber, plant stanol/sterol esters, sugar, and the components of a well-balanced, healthy diet.   Nutrition II class: Lifestyle Skills:  -Group instruction provided by PowerPoint slides, verbal discussion, and written materials to support subject matter. The instructor gives an explanation and  review of label reading, grocery shopping for heart health, heart healthy recipe modifications, and ways to make healthier choices when eating out.   Diabetes Question & Answer:  -Group instruction provided by PowerPoint slides, verbal discussion, and written materials to support subject matter. The instructor gives an explanation and review of diabetes co-morbidities, pre- and post-prandial blood glucose goals, pre-exercise blood glucose goals, signs, symptoms, and treatment of hypoglycemia and hyperglycemia, and foot care basics.   Diabetes Blitz:  -Group instruction provided by PowerPoint slides, verbal discussion, and written materials to support subject matter. The instructor gives an explanation and review of the physiology behind type 1 and type 2 diabetes, diabetes medications and rational behind using different medications, pre- and post-prandial blood glucose recommendations and Hemoglobin A1c goals, diabetes diet, and exercise including blood glucose guidelines for exercising safely.    Portion Distortion:  -Group instruction provided by PowerPoint slides, verbal discussion, written materials, and food models to support subject matter. The instructor gives an explanation of serving size versus portion size, changes in portions sizes over the last 20 years, and what consists of a serving from each food group.   Stress Management:  -Group instruction provided by verbal instruction, video, and written materials to support subject matter.  Instructors review role of stress in heart disease and how to cope with stress positively.     CARDIAC REHAB PHASE II EXERCISE from 10/20/2017 in [place]  Date  09/29/17  Educator  RN  Instruction Review Code  2- Demonstrated Understanding      Exercising on Your Own:  -Group instruction provided by verbal instruction, power point, and written materials to support subject.  Instructors discuss benefits of exercise,  components of exercise, frequency and intensity of exercise, and end points for exercise.  Also discuss use of nitroglycerin and activating EMS.  Review options of places to exercise outside of rehab.  Review guidelines for sex with heart disease.   CARDIAC REHAB PHASE II EXERCISE from 10/20/2017 in [place]  REHAB  Date  09/01/17  Educator  EP  Instruction Review Code  2- Demonstrated Understanding      Cardiac Drugs I:  -Group instruction provided by verbal instruction and written materials to support subject.  Instructor reviews cardiac drug classes: antiplatelets, anticoagulants, beta blockers, and statins.  Instructor discusses reasons, side effects, and lifestyle considerations for each drug class.   CARDIAC REHAB PHASE II EXERCISE from 10/20/2017 in [place]  Date  09/15/17  Educator  Pharmacist  Instruction Review Code  2- Demonstrated Understanding      Cardiac Drugs II:  -Group instruction provided by verbal instruction and written materials to support subject.  Instructor reviews cardiac drug classes: angiotensin converting enzyme inhibitors (ACE-I), angiotensin II receptor blockers (ARBs), nitrates, and calcium channel blockers.  Instructor discusses reasons, side effects, and lifestyle considerations for each drug class.   CARDIAC REHAB PHASE II EXERCISE from 10/20/2017 in [place]  Date  10/20/17  Instruction Review Code  2- Demonstrated Understanding      Anatomy and Physiology of the Circulatory System:  Group verbal and written instruction and models provide basic cardiac anatomy and physiology, with the coronary electrical and arterial systems. Review of: AMI, Angina, Valve disease, Heart Failure, Peripheral Artery Disease, Cardiac Arrhythmia, Pacemakers, and the ICD.   CARDIAC REHAB PHASE II EXERCISE from 10/20/2017 in [place]  Date  10/06/17   Instruction Review Code  2- Demonstrated Understanding      Other Education:  -Group or individual verbal, written, or video instructions that support the educational goals of the cardiac rehab program.   Holiday Eating Survival Tips:  -Group instruction provided by PowerPoint slides, verbal discussion, and written materials to support subject matter. The instructor gives patients tips, tricks, and techniques to help them not only survive but enjoy the holidays despite the onslaught of food that accompanies the holidays.   Knowledge Questionnaire Score: Knowledge Questionnaire Score - 08/24/17 1036      Knowledge Questionnaire Score   Pre Score  24/24       Core Components/Risk Factors/Patient Goals at Admission: Personal Goals and Risk Factors at Admission - 08/24/17 1049      Core Components/Risk Factors/Patient Goals on Admission   Lipids  Yes    Intervention  Provide education and support for participant on nutrition & aerobic/resistive exercise along with prescribed medications to achieve LDL 70mg , HDL >40mg .    Expected Outcomes  Short Term: Participant states understanding of desired cholesterol values and is compliant with medications prescribed. Participant is following exercise prescription and nutrition guidelines.;Long Term: Cholesterol controlled with medications as prescribed, with individualized exercise RX and with personalized nutrition plan. Value goals: LDL < 70mg , HDL > 40 mg.       Core Components/Risk Factors/Patient Goals Review:  Goals and Risk Factor Review    Row Name 08/30/17 1626 09/16/17 1420 10/14/17 1435 11/10/17 1652       Core Components/Risk Factors/Patient Goals Review   Personal Goals Review  Lipids  Lipids;Stress  Lipids;Stress  Lipids;Stress    Review  pt with minimal CAD RF demontrates eagerness to participate in CR program. pt personal goals are to learn exercise guidelines and learn safe exercise thresholds.  pt with minimal CAD RF  demontrates eagerness to participate in CR program. pt personal goals are to learn exercise guidelines and learn safe exercise thresholds.  pt with minimal CAD RF demontrates eagerness to participate in CR program. Tyresse's  vital signs have been stable. Cyprian has lost 1.7 kg since starting the program. Will continue to offer emotional support as needed.  pt with minimal CAD RF demontrates eagerness to participate in CR program. Allyn's vital signs have been stable. Damario has lost 1.7 kg since starting the program. Will continue to offer emotional support as needed.    Expected Outcomes  pt will participate in CR exercise, nutrition and lifestyle modification to decrease overall RF.   pt will participate in CR exercise, nutrition and lifestyle modification to decrease overall RF.   pt will participate in CR exercise, nutrition and lifestyle modification to decrease overall RF.   pt will participate in CR exercise, nutrition and lifestyle modification to decrease overall RF. Kennard will complete and graduate from cardiac rehab next week       Core Components/Risk Factors/Patient Goals at Discharge (Final Review):  Goals and Risk Factor Review - 11/10/17 1652      Core Components/Risk Factors/Patient Goals Review   Personal Goals Review  Lipids;Stress    Review  pt with minimal CAD RF demontrates eagerness to participate in CR program. Avin's vital signs have been stable. Gilles has lost 1.7 kg since starting the program. Will continue to offer emotional support as needed.    Expected Outcomes  pt will participate in CR exercise, nutrition and lifestyle modification to decrease overall RF. Clarance will complete and graduate from cardiac rehab next week       ITP Comments: ITP Comments    Row Name 08/23/17 1227 08/30/17 1625 09/16/17 1417 10/14/17 1435 11/10/17 1650   ITP Comments  Dr. Fransico Him, Medical Director   pt started group exercise. pt tolerated light activity without difficulty. pt oriented to  exercise equipment and safety routine.   30 Day ITP Review. Patient with good attendance and partcipation in phase 2 cardiac rehab  30 Day ITP Review. Patient with good attendance and partcipation in phase 2 cardiac rehab  30 Day ITP Review. Patient with good attendance and partcipation in phase 2 cardiac rehab      Comments: See ITP comments.Barnet Pall, RN,BSN 11/10/2017 4:56 PM

## 2017-11-12 ENCOUNTER — Encounter (HOSPITAL_COMMUNITY)
Admission: RE | Admit: 2017-11-12 | Discharge: 2017-11-12 | Disposition: A | Discharge status: Home / Self Care | Payer: Managed Care, Other (non HMO) | Source: Ambulatory Visit | Attending: Cardiology | Admitting: Cardiology

## 2017-11-12 DIAGNOSIS — Z955 Presence of coronary angioplasty implant and graft: Secondary | ICD-10-CM

## 2017-11-12 DIAGNOSIS — I213 ST elevation (STEMI) myocardial infarction of unspecified site: Secondary | ICD-10-CM | POA: Diagnosis not present

## 2017-11-15 ENCOUNTER — Encounter (HOSPITAL_COMMUNITY)
Admission: RE | Admit: 2017-11-15 | Discharge: 2017-11-15 | Disposition: A | Discharge status: Home / Self Care | Payer: Managed Care, Other (non HMO) | Source: Ambulatory Visit | Attending: Cardiology | Admitting: Cardiology

## 2017-11-15 DIAGNOSIS — I213 ST elevation (STEMI) myocardial infarction of unspecified site: Secondary | ICD-10-CM

## 2017-11-15 DIAGNOSIS — Z955 Presence of coronary angioplasty implant and graft: Secondary | ICD-10-CM

## 2017-11-17 ENCOUNTER — Encounter (HOSPITAL_COMMUNITY)
Admission: RE | Admit: 2017-11-17 | Discharge: 2017-11-17 | Disposition: A | Discharge status: Home / Self Care | Payer: Managed Care, Other (non HMO) | Source: Ambulatory Visit | Attending: Cardiology | Admitting: Cardiology

## 2017-11-17 DIAGNOSIS — Z955 Presence of coronary angioplasty implant and graft: Secondary | ICD-10-CM

## 2017-11-17 DIAGNOSIS — I213 ST elevation (STEMI) myocardial infarction of unspecified site: Secondary | ICD-10-CM

## 2017-11-17 LAB — HEPATIC FUNCTION PANEL
ALK PHOS: 84 IU/L (ref 39–117)
ALT: 39 IU/L (ref 0–44)
AST: 30 IU/L (ref 0–40)
Albumin: 4.3 g/dL (ref 3.6–4.8)
BILIRUBIN, DIRECT: 0.33 mg/dL (ref 0.00–0.40)
Bilirubin Total: 1.2 mg/dL (ref 0.0–1.2)
TOTAL PROTEIN: 6.2 g/dL (ref 6.0–8.5)

## 2017-11-17 LAB — LIPID PANEL
CHOLESTEROL TOTAL: 103 mg/dL (ref 100–199)
Chol/HDL Ratio: 2.2 ratio (ref 0.0–5.0)
HDL: 47 mg/dL (ref >39)
LDL Calculated: 38 mg/dL (ref 0–99)
Triglycerides: 90 mg/dL (ref 0–149)
VLDL CHOLESTEROL CAL: 18 mg/dL (ref 5–40)

## 2017-11-19 ENCOUNTER — Encounter (HOSPITAL_COMMUNITY)
Admission: RE | Admit: 2017-11-19 | Discharge: 2017-11-19 | Disposition: A | Discharge status: Home / Self Care | Payer: Managed Care, Other (non HMO) | Source: Ambulatory Visit | Attending: Cardiology | Admitting: Cardiology

## 2017-11-19 VITALS — BP 108/76 | HR 73 | Ht 70.0 in | Wt 160.9 lb

## 2017-11-19 DIAGNOSIS — I213 ST elevation (STEMI) myocardial infarction of unspecified site: Secondary | ICD-10-CM | POA: Diagnosis not present

## 2017-11-19 DIAGNOSIS — Z955 Presence of coronary angioplasty implant and graft: Secondary | ICD-10-CM

## 2017-11-19 NOTE — Progress Notes (Signed)
Discharge Progress Report  Patient Details  Name: Jazen Spraggins MRN: [id number] Date of Birth: 11/28/1955 Referring Provider:     [place] from 08/24/2017 in [place]  Referring Provider  Minus Breeding, MD.       Number of Visits: 14  Reason for Discharge:  Patient reached a stable level of exercise. independent in exercise  Smoking History:  Social History   Tobacco Use  Smoking Status Never Smoker  Smokeless Tobacco Never Used    Diagnosis:  ST elevation myocardial infarction (STEMI), unspecified artery (HCC)  Stented coronary artery  ADL UCSD:   Initial Exercise Prescription: Initial Exercise Prescription - 08/24/17 1000      Date of Initial Exercise RX and Referring Provider   Date  08/24/17    Referring Provider  Minus Breeding, MD.      Bike   Level  1.2    Minutes  10    METs  4.02      NuStep   Level  4    SPM  85    Minutes  10    METs  3.1      Track   Laps  12    Minutes  10    METs  3.09      Prescription Details   Frequency (times per week)  3    Duration  Progress to 30 minutes of continuous aerobic without signs/symptoms of physical distress      Intensity   THRR 40-80% of Max Heartrate  64-127    Ratings of Perceived Exertion  11-13    Perceived Dyspnea  0-4      Progression   Progression  Continue to progress workloads to maintain intensity without signs/symptoms of physical distress.      Resistance Training   Training Prescription  Yes    Weight  4lbs    Reps  10-15       Discharge Exercise Prescription (Final Exercise Prescription Changes): Exercise Prescription Changes - 11/19/17 1456      Response to Exercise   Blood Pressure (Admit)  108/76    Blood Pressure (Exercise)  142/80    Blood Pressure (Exit)  108/70    Heart Rate (Admit)  73 bpm    Heart Rate (Exercise)  146 bpm    Heart Rate (Exit)  76 bpm    Rating of Perceived Exertion (Exercise)  14    Symptoms  none    Duration  Continue with 30 min of aerobic exercise without signs/symptoms of physical distress.    Intensity  THRR unchanged      Progression   Progression  Continue to progress workloads to maintain intensity without signs/symptoms of physical distress.    Average METs  8.1      Resistance Training   Training Prescription  Yes    Weight  5lbs    Reps  10-15    Time  10 Minutes      Interval Training   Interval Training  No      Bike   Level  2.4    Minutes  10    METs  7.2      NuStep   Level  7    SPM  85    Minutes  10    METs  8.1      Rower   Level  6    Watts  123    Minutes  10  METs  9.1      Home Exercise Plan   Plans to continue exercise at  Home (comment)    Frequency  Add 2 additional days to program exercise sessions.    Initial Home Exercises Provided  09/06/17       Functional Capacity: 6 Minute Walk    Row Name 08/24/17 0848 11/10/17 1507       6 Minute Walk   Phase  Initial  Discharge    Distance  1600 feet  2200 feet    Distance % Change  -  37.5 %    Distance Feet Change  -  600 ft    Walk Time  6 minutes  6 minutes    # of Rest Breaks  0  0    MPH  3.03  4.17    METS  4.09  5.3    RPE  13  10    VO2 Peak  14.32  18.54    Symptoms  No  No    Resting HR  66 bpm  60 bpm    Resting BP  124/80  112/84    Resting Oxygen Saturation   97 %  -    Exercise Oxygen Saturation  during 6 min walk  99 %  -    Max Ex. HR  96 bpm  101 bpm    Max Ex. BP  122/78  132/62    2 Minute Post BP  108/68  100/70       Psychological, QOL, Others - Outcomes: PHQ 2/9: Depression screen [facility] 2/9 11/19/2017 08/30/2017  Decreased Interest 0 0  Down, Depressed, Hopeless 0 0  PHQ - 2 Score 0 0    Quality of Life: Quality of Life - 11/19/17 0709      Quality of Life   Select  Quality of Life      Quality of Life Scores   Health/Function Pre  20.7 %    Health/Function Post  27.77 %    Health/Function % Change  34.15 %     Socioeconomic Pre  26.56 %    Socioeconomic Post  27 %    Socioeconomic % Change   1.66 %    Psych/Spiritual Pre  24.79 %    Psych/Spiritual Post  28.93 %    Psych/Spiritual % Change  16.7 %    Family Pre  28.3 %    Family Post  27.6 %    Family % Change  -2.47 %    GLOBAL Pre  23.96 %    GLOBAL Post  27.82 %    GLOBAL % Change  16.11 %       Personal Goals: Goals established at orientation with interventions provided to work toward goal. Personal Goals and Risk Factors at Admission - 08/24/17 1049      Core Components/Risk Factors/Patient Goals on Admission   Lipids  Yes    Intervention  Provide education and support for participant on nutrition & aerobic/resistive exercise along with prescribed medications to achieve LDL <54m, HDL >478m    Expected Outcomes  Short Term: Participant states understanding of desired cholesterol values and is compliant with medications prescribed. Participant is following exercise prescription and nutrition guidelines.;Long Term: Cholesterol controlled with medications as prescribed, with individualized exercise RX and with personalized nutrition plan. Value goals: LDL < 7081mHDL > 40 mg.        Personal Goals Discharge: Goals and Risk Factor Review    Row Name 08/30/17  1626 09/16/17 1420 10/14/17 1435 11/10/17 1652       Core Components/Risk Factors/Patient Goals Review   Personal Goals Review  Lipids  Lipids;Stress  Lipids;Stress  Lipids;Stress    Review  pt with minimal CAD RF demontrates eagerness to participate in CR program. pt personal goals are to learn exercise guidelines and learn safe exercise thresholds.  pt with minimal CAD RF demontrates eagerness to participate in CR program. pt personal goals are to learn exercise guidelines and learn safe exercise thresholds.  pt with minimal CAD RF demontrates eagerness to participate in CR program. Milan's vital signs have been stable. Dayvin has lost 1.7 kg since starting the program. Will  continue to offer emotional support as needed.  pt with minimal CAD RF demontrates eagerness to participate in CR program. Kervens's vital signs have been stable. Masashi has lost 1.7 kg since starting the program. Will continue to offer emotional support as needed.    Expected Outcomes  pt will participate in CR exercise, nutrition and lifestyle modification to decrease overall RF.   pt will participate in CR exercise, nutrition and lifestyle modification to decrease overall RF.   pt will participate in CR exercise, nutrition and lifestyle modification to decrease overall RF.   pt will participate in CR exercise, nutrition and lifestyle modification to decrease overall RF. Kentrail will complete and graduate from cardiac rehab next week       Exercise Goals and Review: Exercise Goals    Row Name 08/24/17 0848             Exercise Goals   Increase Physical Activity  Yes       Intervention  Provide advice, education, support and counseling about physical activity/exercise needs.;Develop an individualized exercise prescription for aerobic and resistive training based on initial evaluation findings, risk stratification, comorbidities and participant's personal goals.       Expected Outcomes  Short Term: Attend rehab on a regular basis to increase amount of physical activity.;Long Term: Exercising regularly at least 3-5 days a week.;Long Term: Add in home exercise to make exercise part of routine and to increase amount of physical activity.       Increase Strength and Stamina  Yes       Intervention  Provide advice, education, support and counseling about physical activity/exercise needs.;Develop an individualized exercise prescription for aerobic and resistive training based on initial evaluation findings, risk stratification, comorbidities and participant's personal goals.       Expected Outcomes  Short Term: Increase workloads from initial exercise prescription for resistance, speed, and METs.;Short Term:  Perform resistance training exercises routinely during rehab and add in resistance training at home;Long Term: Improve cardiorespiratory fitness, muscular endurance and strength as measured by increased METs and functional capacity (6MWT)       Able to understand and use rate of perceived exertion (RPE) scale  Yes       Intervention  Provide education and explanation on how to use RPE scale       Expected Outcomes  Short Term: Able to use RPE daily in rehab to express subjective intensity level;Long Term:  Able to use RPE to guide intensity level when exercising independently       Knowledge and understanding of Target Heart Rate Range (THRR)  Yes       Intervention  Provide education and explanation of THRR including how the numbers were predicted and where they are located for reference       Expected Outcomes  Short  Term: Able to state/look up THRR;Long Term: Able to use THRR to govern intensity when exercising independently;Short Term: Able to use daily as guideline for intensity in rehab       Able to check pulse independently  Yes       Intervention  Provide education and demonstration on how to check pulse in carotid and radial arteries.;Review the importance of being able to check your own pulse for safety during independent exercise       Expected Outcomes  Short Term: Able to explain why pulse checking is important during independent exercise;Long Term: Able to check pulse independently and accurately       Understanding of Exercise Prescription  Yes       Intervention  Provide education, explanation, and written materials on patient's individual exercise prescription       Expected Outcomes  Short Term: Able to explain program exercise prescription;Long Term: Able to explain home exercise prescription to exercise independently          Nutrition & Weight - Outcomes: Pre Biometrics - 08/24/17 1100      Pre Biometrics   Height  5' 10"  (1.778 m)    Weight  166 lb 3.6 oz (75.4 kg)     Waist Circumference  34.75 inches    Hip Circumference  38.5 inches    Waist to Hip Ratio  0.9 %    BMI (Calculated)  23.85    Triceps Skinfold  13 mm    % Body Fat  22.7 %    Grip Strength  42.5 kg    Flexibility  9.5 in    Single Leg Stand  11.6 seconds      Post Biometrics - 11/19/17 1552       Post  Biometrics   Height  5' 10"  (1.778 m)    Weight  160 lb 15 oz (73 kg)    Waist Circumference  32.5 inches    Hip Circumference  37.25 inches    Waist to Hip Ratio  0.87 %    BMI (Calculated)  23.09    Triceps Skinfold  11 mm    % Body Fat  20.7 %    Grip Strength  49.5 kg    Flexibility  13 in    Single Leg Stand  30 seconds       Nutrition: Nutrition Therapy & Goals - 11/19/17 1001      Nutrition Therapy   Diet  heart healthy      Personal Nutrition Goals   Nutrition Goal  pt to identify and limit food sources of saturated fat, trans fat, and sodium      Intervention Plan   Intervention  Prescribe, educate and counsel regarding individualized specific dietary modifications aiming towards targeted core components such as weight, hypertension, lipid management, diabetes, heart failure and other comorbidities.;Nutrition handout(s) given to patient.    Expected Outcomes  Short Term Goal: Understand basic principles of dietary content, such as calories, fat, sodium, cholesterol and nutrients.;Long Term Goal: Adherence to prescribed nutrition plan.       Nutrition Discharge: Nutrition Assessments - 11/19/17 1000      MEDFICTS Scores   Pre Score  12    Post Score  3    Score Difference  -9       Education Questionnaire Score: Knowledge Questionnaire Score - 11/19/17 0710      Knowledge Questionnaire Score   Pre Score  24/24    Post Score  24/24  Goals reviewed with patient; copy given to patient.Stanislav graduated from cardiac rehab program on 11/19/17 with completion of 36 exercise sessions in Phase II. Pt maintained good attendance and progressed nicely  during his participation in rehab as evidenced by increased MET level.   Medication list reconciled. Repeat  PHQ score- 0 .  Pt has made significant lifestyle changes and should be commended for his success. Pt feels he has achieved his goals during cardiac rehab. Shane increased his distance on his post exercise walk test and lost 6 pounds. We are proud of Rmani's progress. Pt plans to continue exercise at [employer] with his wife and walking.Barnet Pall, RN,BSN 12/14/2017 4:04 PM

## 2017-11-22 ENCOUNTER — Encounter (HOSPITAL_COMMUNITY): Payer: Managed Care, Other (non HMO)

## 2017-11-24 ENCOUNTER — Encounter (HOSPITAL_COMMUNITY): Payer: Managed Care, Other (non HMO)

## 2017-11-25 ENCOUNTER — Encounter: Payer: Self-pay | Admitting: *Deleted

## 2017-11-26 ENCOUNTER — Encounter (HOSPITAL_COMMUNITY): Payer: Managed Care, Other (non HMO)

## 2017-11-26 ENCOUNTER — Encounter (HOSPITAL_COMMUNITY)
Admission: RE | Admit: 2017-11-26 | Discharge: 2017-11-26 | Disposition: A | Discharge status: Home / Self Care | Payer: Managed Care, Other (non HMO) | Source: Ambulatory Visit | Attending: Cardiology | Admitting: Cardiology

## 2017-11-29 ENCOUNTER — Encounter (HOSPITAL_COMMUNITY): Payer: Managed Care, Other (non HMO)

## 2017-12-01 ENCOUNTER — Encounter (HOSPITAL_COMMUNITY): Payer: Managed Care, Other (non HMO)

## 2018-01-04 ENCOUNTER — Telehealth: Payer: Self-pay | Admitting: Emergency Medicine

## 2018-01-04 NOTE — Telephone Encounter (Signed)
Called and spoke to patient wife, patient and wife is requesting that CT be with contrast, states his last one was with contrast, so the images can be compared they prefer that it is with contrast.   RB please advise if you want CT with or without contrast?

## 2018-01-04 NOTE — Telephone Encounter (Signed)
ATC pt, no answer. Left message for pt to call back.  I do not see a previous scan with contrast.

## 2018-01-04 NOTE — Telephone Encounter (Signed)
Please let her know that we can see the things we are interested in using a CT without contrast, that's why I didn't order. If they feel strongly about having contrast, are willing to accept risk associated, then it is OK with me to change the order.

## 2018-01-04 NOTE — Telephone Encounter (Signed)
Dorian Pod returned call, CB is [phone]

## 2018-01-05 NOTE — Telephone Encounter (Signed)
Called patients wife, unable to reach left message to give us a call back.  

## 2018-01-06 NOTE — Telephone Encounter (Signed)
Spoke with Casey Cummings, advised Casey Cummings of Dr. Agustina Caroli recommendations and he stated he would go with what RB wanted and we do not have to change the CT order. Nothing further is needed.

## 2018-02-02 ENCOUNTER — Ambulatory Visit (INDEPENDENT_AMBULATORY_CARE_PROVIDER_SITE_OTHER)
Admission: RE | Admit: 2018-02-02 | Discharge: 2018-02-02 | Disposition: A | Discharge status: Home / Self Care | Payer: Managed Care, Other (non HMO) | Source: Ambulatory Visit | Attending: Emergency Medicine | Admitting: Emergency Medicine

## 2018-02-02 DIAGNOSIS — R918 Other nonspecific abnormal finding of lung field: Secondary | ICD-10-CM

## 2018-02-04 ENCOUNTER — Other Ambulatory Visit: Payer: Managed Care, Other (non HMO)

## 2018-02-08 ENCOUNTER — Encounter: Payer: Self-pay | Admitting: Emergency Medicine

## 2018-02-08 ENCOUNTER — Ambulatory Visit (INDEPENDENT_AMBULATORY_CARE_PROVIDER_SITE_OTHER): Payer: Managed Care, Other (non HMO) | Admitting: Emergency Medicine

## 2018-02-08 DIAGNOSIS — R9389 Abnormal findings on diagnostic imaging of other specified body structures: Secondary | ICD-10-CM

## 2018-02-08 DIAGNOSIS — R918 Other nonspecific abnormal finding of lung field: Secondary | ICD-10-CM

## 2018-02-08 NOTE — Patient Instructions (Addendum)
We will plan to repeat your CT chest without contrast in November 2020.  We will perform pulmonary function testing to establish your baseline. Please follow with Dr. Lamonte Sakai in November 2020 to review your CT scan together or sooner if you have any changes in your breathing or other problems.

## 2018-02-08 NOTE — Assessment & Plan Note (Signed)
With some biapical scar, some rounded component.  This is completely stable compared with prior CT in April 2019 and also going back to a neck CT from 2017.  We will plan to repeat in November 2020 and if stable at that time we can probably stop following given his low risk profile.  We will perform pulmonary function testing to establish a baseline.  This will allow [place] to recheck should he experience clinical change.

## 2018-02-08 NOTE — Addendum Note (Signed)
Addended by: Len Blalock on: 02/08/2018 10:10 AM   Modules accepted: Orders

## 2018-02-08 NOTE — Progress Notes (Signed)
Subjective:    Patient ID: Casey Cummings, male    DOB: 06/08/1955, 62 y.o.   MRN: [id number]  HPI 62 year old never smoker with a history of coronary artery disease, PTCI.  He also has hyperlipidemia.  He had a fall, L rib injury in March, underwent CXR in April to evaluate. Showed some apical airspace disease, prompted CT as below.   He presents today for evaluation of an abnormal CT chest.  This was done on 06/24/2017 at [facility] and I have reviewed.  This shows biapical airspace disease with a more nodular focus in the right upper lobe with some associated bronchiectasis and prominent pleural-parenchymal scarring.  There is a neck CT available from November 2017 that I have reviewed.  This shows very similar, actually slightly more prominent biapical scarring with some associated traction changes.  Very little interval difference except for some decreased scar on the most recent scan inferior to the apex.    He then was admitted for angina, did not have an MI. Cardiac w/u followed.  During that admit > Had negative PPD. Negative quant GOLD testing.  Coccidio Ab was negative.   He underwent coronary CT scan 07/09/2017 that showed hemodynamically significant stenosis in the proximal LAD and a cardiac catheterization was recommended. This was done 07/08/17 > had a LAD stent.   He has a non-prod cough, sometimes some associated post-tussive gagging. He did have some exertional chest pain, got better after his stent to LAD. Some occasional chest tightness and congestion. He has had some raynaud-like phenomenon on feet, hands in the past in extreme cold.   ROV 02/08/18 --Casey Cummings is 62 and has a history of coronary artery disease.  We have followed him for some biapical airspace disease noted on CT scans of the chest 06/24/2017 but going back to a CT neck from 01/2016.  He has had negative PPD and QuantiFERON gold (has a significant travel history).  His follow-up scan was done on 02/02/2018 and I  have reviewed.  This shows similar appearance with some biapical pleural-parenchymal scarring more so on the right than on the left, some mild subpleural reticulation in the left lower lobe but no new nodules, new findings, infiltrates.  He is feeling well, good functional capacity.  No new pulmonary symptoms.  He does have occasional cough, complains of some intermittent chest wall burning that he thinks may be musculoskeletal.  He does not think his GERD.     Review of Systems  Constitutional: Negative for fever and unexpected weight change.  HENT: Negative for congestion, dental problem, ear pain, nosebleeds, postnasal drip, rhinorrhea, sinus pressure, sneezing, sore throat and trouble swallowing.   Eyes: Negative for redness and itching.  Respiratory: Positive for cough and shortness of breath. Negative for chest tightness and wheezing.   Cardiovascular: Positive for chest pain. Negative for palpitations and leg swelling.  Gastrointestinal: Negative for nausea and vomiting.  Genitourinary: Negative for dysuria.  Musculoskeletal: Negative for joint swelling.  Skin: Negative for rash.  Neurological: Negative for headaches.  Hematological: Does not bruise/bleed easily.  Psychiatric/Behavioral: Negative for dysphoric mood. The patient is not nervous/anxious.     Past Medical History:  Diagnosis Date  . CAD (coronary artery disease)    a. 07/2017 s/p Ant STEMI/PCI: initially admitted w/ chest pain->Cardiac CTA: Ca2+ 0, >70%LAD stenosis->pt later dev Ant STEMI->Cath: LM nl, LAD 90p (3.5x16 Synergy DES), 71m, LCX nl, RCA nl.  . Diastolic dysfunction    a. 07/2017 Echo: EF 65-70%, mild  LVH, Gr1 DD.  Marland Kitchen Dyslipidemia, goal LDL below 70   . Pulmonary nodules    a. 06/2017 Chest CT [facility]): biapical airspace dzs w/ more nodular focus in RUL w/ some associated bronchiectasis and prominent pleural-parenchymal scarring (seen by Pulm - similar findings on CT neck in 01/2016).     Family History    Problem Relation Age of Onset  . CAD Mother      Social History   Socioeconomic History  . Marital status: Married    Spouse name: Not on file  . Number of children: Not on file  . Years of education: Not on file  . Highest education level: Not on file  Occupational History  . 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 Financial resource strain: Not on file  . Food insecurity:    Worry: Not on file    Inability: Not on file  . Transportation needs:    Medical: Not on file    Non-medical: Not on file  Tobacco Use  . Smoking status: Never Smoker  . Smokeless tobacco: Never Used  Substance and Sexual Activity  . Alcohol use: Yes    Comment: Occasionally.  . Drug use: Never  . Sexual activity: Not on file  Lifestyle  . Physical activity:    Days per week: Not on file    Minutes per session: Not on file  . Stress: Not on file  Relationships  . Social connections:    Talks on phone: Not on file    Gets together: Not on file    Attends religious service: Not on file    Active member of club or organization: Not on file    Attends meetings of clubs or organizations: Not on file    Relationship status: Not on file  . Intimate partner violence:    Fear of current or ex partner: Not on file    Emotionally abused: Not on file    Physically abused: Not on file    Forced sexual activity: Not on file  Other Topics Concern  . Not on file  Social History Narrative   Lives locally with wife.   Just moved from [place]. Has also lived in [place], Rep of [place], has worked with [place]. 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 No malignancy in family.    No Known Allergies   Outpatient Medications Prior to Visit  Medication Sig Dispense Refill  . aspirin 81 MG chewable tablet Chew 1 tablet (81 mg total) by mouth daily. 90 tablet 3  . atorvastatin (LIPITOR) 80  MG tablet Take 1 tablet (80 mg total) by mouth daily at 6 PM. 90 tablet 3  . metoprolol succinate (TOPROL XL) 25 MG 24 hr tablet Take 0.5 tablets (12.5 mg total) by mouth daily. 45 tablet 1  . Multiple Vitamin (MULTIVITAMIN) tablet Take 1 tablet by mouth daily.    . nitroGLYCERIN (NITROSTAT) 0.4 MG SL tablet Place 1 tablet (0.4 mg total) under the tongue every 5 (five) minutes as needed for chest pain. 25 tablet 3  . ticagrelor (BRILINTA) 90 MG TABS tablet Take 1 tablet (90 mg total) by mouth 2 (two) times daily. 180 tablet 3   No facility-administered medications prior to visit.         Objective:   Physical Exam Vitals:   02/08/18 0931  BP: 118/66  Pulse: 71  SpO2: 99%  Weight: 162 lb (73.5 kg)  Height: 5\' 10"  (1.778 m)   Gen: Pleasant, well-nourished, in no distress,  normal affect  ENT: No lesions,  mouth clear,  oropharynx clear, no postnasal drip  Neck: No JVD, no stridor  Lungs: No use of accessory muscles, clear bilaterally with no crackles, no wheezes  Cardiovascular: RRR, heart sounds normal, no murmur or gallops, no peripheral edema  Musculoskeletal: No deformities, no cyanosis or clubbing  Neuro: alert, non focal  Skin: Warm, no lesions or rash      Assessment & Plan:  Abnormal CT of the chest With some biapical scar, some rounded component.  This is completely stable compared with prior CT in April 2019 and also going back to a neck CT from 2017.  We will plan to repeat in November 2020 and if stable at that time we can probably stop following given his low risk profile.  We will perform pulmonary function testing to establish a baseline.  This will allow [place] to recheck should he experience clinical change.  Baltazar Apo, MD, PhD 02/08/2018, 10:08 AM [facility] [phone] or if no answer [phone]

## 2018-02-14 ENCOUNTER — Telehealth: Payer: Self-pay | Admitting: Cardiology

## 2018-02-14 NOTE — Telephone Encounter (Signed)
° ° °  Patient's spouse calling to request order for labs.  Spouse states patient feels fatigue and muscle ache

## 2018-02-14 NOTE — Telephone Encounter (Signed)
Spoke with pt wife, they are asking for his kidney function to be checked because it had spiked at his medical doctors. Aware dr Percival Spanish is not in the office and they will contact the medical doctor for repeat lab work.

## 2018-02-15 ENCOUNTER — Ambulatory Visit (INDEPENDENT_AMBULATORY_CARE_PROVIDER_SITE_OTHER): Payer: Managed Care, Other (non HMO) | Admitting: Emergency Medicine

## 2018-02-15 DIAGNOSIS — R9389 Abnormal findings on diagnostic imaging of other specified body structures: Secondary | ICD-10-CM

## 2018-02-15 LAB — PULMONARY FUNCTION TEST
DL/VA % pred: 81 %
DL/VA: 3.79 ml/min/mmHg/L
DLCO UNC: 30.05 ml/min/mmHg
DLCO unc % pred: 91 %
FEF 25-75 Post: 2.55 L/sec
FEF 25-75 Pre: 2.49 L/sec
FEF2575-%Change-Post: 2 %
FEF2575-%PRED-PRE: 85 %
FEF2575-%Pred-Post: 87 %
FEV1-%Change-Post: -1 %
FEV1-%Pred-Post: 106 %
FEV1-%Pred-Pre: 108 %
FEV1-PRE: 3.9 L
FEV1-Post: 3.85 L
FEV1FVC-%CHANGE-POST: 2 %
FEV1FVC-%PRED-PRE: 88 %
FEV6-%Change-Post: -3 %
FEV6-%Pred-Post: 121 %
FEV6-%Pred-Pre: 125 %
FEV6-POST: 5.53 L
FEV6-Pre: 5.73 L
FEV6FVC-%Change-Post: 1 %
FEV6FVC-%PRED-POST: 103 %
FEV6FVC-%Pred-Pre: 102 %
FVC-%Change-Post: -3 %
FVC-%PRED-PRE: 122 %
FVC-%Pred-Post: 117 %
FVC-PRE: 5.86 L
FVC-Post: 5.63 L
PRE FEV1/FVC RATIO: 67 %
PRE FEV6/FVC RATIO: 98 %
Post FEV1/FVC ratio: 68 %
Post FEV6/FVC ratio: 99 %
RV % pred: 102 %
RV: 2.36 L
TLC % PRED: 114 %
TLC: 8.14 L

## 2018-02-15 NOTE — Progress Notes (Signed)
PFT done today. 

## 2018-02-22 ENCOUNTER — Telehealth: Payer: Self-pay | Admitting: Emergency Medicine

## 2018-02-22 NOTE — Telephone Encounter (Signed)
Please let the patient know that I reviewed his pulmonary function testing.  This shows normal lung volumes, normal volumes of air movement when he forces of breath, normal gas exchange.  Interestingly, there was some evidence for some slightly impaired airflow consistent with some subclinical asthma.  There is nothing that we need to do about this as long as he is not having breathing symptoms.  All the same I am glad that we have established a baseline in case his symptoms change going forward.

## 2018-02-22 NOTE — Telephone Encounter (Signed)
Looked in procedures tab of pt's chart and saw that the PFT was completed.  Called and spoke with pt's wife Dorian Pod letting her know that we did see that pt did complete this. Stated to her once RB reviewed the PFT, we would call them to let them know the results. Dorian Pod expressed understanding.  Routing to RB for an FYI.

## 2018-02-22 NOTE — Telephone Encounter (Signed)
Called and spoke with pt's wife Dorian Pod letting her know the results of pt's PFT. Dorian Pod expressed understanding. Nothing further needed.

## 2018-06-27 ENCOUNTER — Other Ambulatory Visit: Payer: Self-pay

## 2018-06-27 MED ORDER — METOPROLOL SUCCINATE ER 25 MG PO TB24: 12.5000 mg | ORAL_TABLET | Freq: Every day | ORAL | 1 refills | Status: DC

## 2018-06-27 MED ORDER — TICAGRELOR 90 MG PO TABS: 90.0000 mg | ORAL_TABLET | Freq: Two times a day (BID) | ORAL | 3 refills | Status: DC

## 2018-06-27 MED ORDER — ATORVASTATIN CALCIUM 80 MG PO TABS: 80.0000 mg | ORAL_TABLET | Freq: Every day | ORAL | 3 refills | Status: DC

## 2018-07-22 ENCOUNTER — Telehealth: Payer: Self-pay | Admitting: Cardiology

## 2018-07-22 NOTE — Telephone Encounter (Signed)
Mychart, smartphone, consent (verbal), pre reg complete 07/22/18 AF

## 2018-07-24 NOTE — Progress Notes (Signed)
Virtual Visit via Telephone Note   This visit type was conducted due to national recommendations for restrictions regarding the COVID-19 Pandemic (e.g. social distancing) in an effort to limit this patient's exposure and mitigate transmission in our community.  Due to his co-morbid illnesses, this patient is at least at moderate risk for complications without adequate follow up.  This format is felt to be most appropriate for this patient at this time.  The patient did not have access to video technology/had technical difficulties with video requiring transitioning to audio format only (telephone).  All issues noted in this document were discussed and addressed.  No physical exam could be performed with this format.  Please refer to the patient's chart for his  consent to telehealth for [facility].   Date:  07/24/2018   ID:  Casey Cummings, DOB [birth year], MRN [id number]  Patient Location: Home Provider Location: Home  PCP:  Francesca Oman, DO  Cardiologist:  Minus Breeding, MD  Electrophysiologist:  None   Evaluation Performed:  Follow-Up Visit  Chief Complaint:  CAD  History of Present Illness:    Casey Cummings is a 63 y.o. male  who presents for follow up of NSTEMI in May of last year.  He had a DES to the LAD.  Since I last saw him he has done OK.  He has some mild discomfort with his muscles he thinks but this is not limiting.  He gets some vague chest discomfort when he is emotionally stressed.  However, he can walk routinely 3-4 times per week without bringing on any of the symptoms. The patient denies any new symptoms such as chest discomfort, neck or arm discomfort. There has been no new shortness of breath, PND or orthopnea. There have been no reported palpitations, presyncope or syncope.  He does not have any of the discomfort that he had at the time of his MI.  The patient has no symptoms of cough fever chills shortness of breath suggestive of COVID-19.  Prior to Admission  medications   Medication Sig Start Date End Date Taking? Authorizing Provider  aspirin 81 MG chewable tablet Chew 1 tablet (81 mg total) by mouth daily. 07/10/17  Yes Kroeger, Daleen Snook M., PA-C  atorvastatin (LIPITOR) 80 MG tablet Take 1 tablet (80 mg total) by mouth daily at 6 PM. 06/27/18  Yes Kroeger, Daleen Snook M., PA-C  metoprolol succinate (TOPROL XL) 25 MG 24 hr tablet Take 0.5 tablets (12.5 mg total) by mouth daily. 06/27/18  Yes Minus Breeding, MD  Multiple Vitamin (MULTIVITAMIN) tablet Take 1 tablet by mouth daily.   Yes [provider]  nitroGLYCERIN (NITROSTAT) 0.4 MG SL tablet Place 1 tablet (0.4 mg total) under the tongue every 5 (five) minutes as needed for chest pain. 07/09/17  Yes Kroeger, Lorelee Cover., PA-C  ticagrelor (BRILINTA) 90 MG TABS tablet Take 1 tablet (90 mg total) by mouth 2 (two) times daily. 06/27/18  Yes Kroeger, Lorelee Cover., PA-C     Past Medical History:  Diagnosis Date  . CAD (coronary artery disease)    a. 07/2017 s/p Ant STEMI/PCI: initially admitted w/ chest pain->Cardiac CTA: Ca2+ 0, >70%LAD stenosis->pt later dev Ant STEMI->Cath: LM nl, LAD 90p (3.5x16 Synergy DES), 75m, LCX nl, RCA nl.  . Diastolic dysfunction    a. 07/2017 Echo: EF 65-70%, mild LVH, Gr1 DD.  Marland Kitchen Dyslipidemia, goal LDL below 70   . Pulmonary nodules    a. 06/2017 Chest CT [facility]): biapical airspace dzs w/ more nodular  focus in RUL w/ some associated bronchiectasis and prominent pleural-parenchymal scarring (seen by Pulm - similar findings on CT neck in 01/2016).   Past Surgical History:  Procedure Laterality Date  . CORONARY/GRAFT ACUTE MI REVASCULARIZATION N/A 07/08/2017   Procedure: CORONARY/GRAFT ACUTE MI REVASCULARIZATION;  Surgeon: Leonie Man, MD;  Location: [facility];  Service: Cardiovascular;  Laterality: N/A;  . LEFT HEART CATH AND CORONARY ANGIOGRAPHY N/A 07/08/2017   Procedure: LEFT HEART CATH AND CORONARY ANGIOGRAPHY;  Surgeon: Leonie Man, MD;  Location: [facility];  Service: Cardiovascular;  Laterality: N/A;     No outpatient medications have been marked as taking for the 07/25/18 encounter (Appointment) with Minus Breeding, MD.     Allergies:   Patient has no known allergies.   Social History   Tobacco Use  . Smoking status: Never Smoker  . Smokeless tobacco: Never Used  Substance Use Topics  . Alcohol use: Yes    Comment: Occasionally.  . Drug use: Never     Family Hx: The patient's family history includes CAD in his mother.  ROS:   Please see the history of present illness.     All other systems reviewed and are negative.   Prior CV studies:   The following studies were reviewed today:  None  Labs/Other Tests and Data Reviewed:    EKG:  No ECG reviewed.  Recent Labs: 08/14/2017: BUN 17; Creatinine, Ser 1.04; Hemoglobin 14.3; Platelets 252; Potassium 3.5; Sodium 137 11/16/2017: ALT 39   Recent Lipid Panel Lab Results  Component Value Date/Time   CHOL 103 11/16/2017 08:19 AM   TRIG 90 11/16/2017 08:19 AM   HDL 47 11/16/2017 08:19 AM   CHOLHDL 2.2 11/16/2017 08:19 AM   CHOLHDL 3.6 07/08/2017 12:44 PM   LDLCALC 38 11/16/2017 08:19 AM    Wt Readings from Last 3 Encounters:  02/08/18 162 lb (73.5 kg)  11/19/17 160 lb 15 oz (73 kg)  10/28/17 164 lb 12.8 oz (74.8 kg)     Objective:    Vital Signs:  There were no vitals taken for this visit.   VITAL SIGNS:  reviewed  ASSESSMENT & PLAN:    CAD:   The patient has no new sypmtoms.  No further cardiovascular testing is indicated.  We will continue with aggressive risk reduction.  He can stop his Brilinta.  LUNG NODULES:  This is stable and followed by Dr. Lamonte Sakai.   I reviewed these records.    DYSLIPIDEMIA :   Lipids in May were excellent.  He will continue with the high-dose Lipitor. I will check lipids in Sept with liver enzymes    COVID-19 Education: The signs and symptoms of COVID-19 were discussed with the patient and how to seek care for  testing (follow up with PCP or arrange E-visit).  The importance of social distancing was discussed today.  Time:   Today, I have spent 16 minutes with the patient with telehealth technology discussing the above problems.     Medication Adjustments/Labs and Tests Ordered: Current medicines are reviewed at length with the patient today.  Concerns regarding medicines are outlined above.   Tests Ordered: No orders of the defined types were placed in this encounter.   Medication Changes: No orders of the defined types were placed in this encounter.   Disposition:  Follow up with me in one year.  Signed, Minus Breeding, MD  07/24/2018 1:49 PM    [facility]

## 2018-07-25 ENCOUNTER — Encounter: Payer: Self-pay | Admitting: Cardiology

## 2018-07-25 ENCOUNTER — Telehealth (INDEPENDENT_AMBULATORY_CARE_PROVIDER_SITE_OTHER): Payer: Managed Care, Other (non HMO) | Admitting: Cardiology

## 2018-07-25 VITALS — BP 111/76 | HR 67 | Ht 70.0 in | Wt 160.0 lb

## 2018-07-25 DIAGNOSIS — E785 Hyperlipidemia, unspecified: Secondary | ICD-10-CM | POA: Diagnosis not present

## 2018-07-25 DIAGNOSIS — Z79899 Other long term (current) drug therapy: Secondary | ICD-10-CM | POA: Insufficient documentation

## 2018-07-25 DIAGNOSIS — Z7189 Other specified counseling: Secondary | ICD-10-CM | POA: Insufficient documentation

## 2018-07-25 NOTE — Patient Instructions (Signed)
Medication Instructions:  STOP- Brilinta  If you need a refill on your cardiac medications before your next appointment, please call your pharmacy.  Labwork: Fasting Lipid and Liver in September HERE IN OUR OFFICE AT [facility]  You will need to fast. DO NOT EAT OR DRINK PAST MIDNIGHT.       Take the provided lab slips with you to the lab for your blood draw.   When you have your labs (blood work) drawn today and your tests are completely normal, you will receive your results only by MyChart Message (if you have MyChart) -OR-  A paper copy in the mail.  If you have any lab test that is abnormal or we need to change your treatment, we will call you to review these results.  Testing/Procedures: None Ordered  Follow-Up: You will need a follow up appointment in 1 Year.  Please call our office 2 months in advance to schedule this appointment.  You may see Minus Breeding, MD or one of the following Advanced Practice Providers on your designated Care Team:   Rosaria Ferries, PA-C . Jory Sims, DNP, ANP   At [facility], you and your health needs are our priority.  As part of our continuing mission to provide you with exceptional heart care, we have created designated Provider Care Teams.  These Care Teams include your primary Cardiologist (physician) and Advanced Practice Providers (APPs -  Physician Assistants and Nurse Practitioners) who all work together to provide you with the care you need, when you need it.  Thank you for choosing [facility] at [facility]!!

## 2018-11-29 ENCOUNTER — Other Ambulatory Visit: Payer: Self-pay | Admitting: Cardiology

## 2019-02-06 ENCOUNTER — Ambulatory Visit (INDEPENDENT_AMBULATORY_CARE_PROVIDER_SITE_OTHER)
Admission: RE | Admit: 2019-02-06 | Discharge: 2019-02-06 | Disposition: A | Discharge status: Home / Self Care | Payer: Managed Care, Other (non HMO) | Source: Ambulatory Visit | Attending: Emergency Medicine | Admitting: Emergency Medicine

## 2019-02-06 ENCOUNTER — Other Ambulatory Visit: Payer: Self-pay

## 2019-02-06 DIAGNOSIS — R918 Other nonspecific abnormal finding of lung field: Secondary | ICD-10-CM

## 2019-02-07 ENCOUNTER — Encounter: Payer: Self-pay | Admitting: Emergency Medicine

## 2019-02-07 ENCOUNTER — Ambulatory Visit (INDEPENDENT_AMBULATORY_CARE_PROVIDER_SITE_OTHER): Payer: Managed Care, Other (non HMO) | Admitting: Emergency Medicine

## 2019-02-07 DIAGNOSIS — R9389 Abnormal findings on diagnostic imaging of other specified body structures: Secondary | ICD-10-CM | POA: Diagnosis not present

## 2019-02-07 DIAGNOSIS — J449 Chronic obstructive pulmonary disease, unspecified: Secondary | ICD-10-CM | POA: Diagnosis not present

## 2019-02-07 NOTE — Progress Notes (Signed)
Subjective:    Patient ID: Casey Cummings, male    DOB: 1955-09-16, 63 y.o.   MRN: [id number]  HPI 63 year old never smoker with a history of coronary artery disease, PTCI.  He also has hyperlipidemia.   ROV 02/08/18 --Casey Cummings is 63 and has a history of coronary artery disease.  We have followed him for some biapical airspace disease noted on CT scans of the chest 06/24/2017 but going back to a CT neck from 01/2016.  He has had negative PPD and QuantiFERON gold (has a significant travel history).  His follow-up scan was done on 02/02/2018 and I have reviewed.  This shows similar appearance with some biapical pleural-parenchymal scarring more so on the right than on the left, some mild subpleural reticulation in the left lower lobe but no new nodules, new findings, infiltrates.  He is feeling well, good functional capacity.  No new pulmonary symptoms.  He does have occasional cough, complains of some intermittent chest wall burning that he thinks may be musculoskeletal.  He does not think his GERD.  ROV 02/07/2019 --this a follow-up visit for 63 year old man with a history of abnormal CT scan of the chest - biapical scar with some traction btx. Negative PPD and Quant-gold. We repeated his Ct chest 11/30 and I have reviewed, shows persistent biapical scar and some calcified granulomas without any changes compared with prior. No new worrisome nodules. 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 No BD response. He has some occasional throat clearing and cough. No significant dyspnea - he is able to hike and exert himself.      Review of Systems  Constitutional: Negative for fever and unexpected weight change.  HENT: Negative for congestion, dental problem, ear pain, nosebleeds, postnasal drip, rhinorrhea, sinus pressure, sneezing, sore throat and trouble swallowing.   Eyes: Negative for redness and itching.  Respiratory:  Positive for cough and shortness of breath. Negative for chest tightness and wheezing.   Cardiovascular: Positive for chest pain. Negative for palpitations and leg swelling.  Gastrointestinal: Negative for nausea and vomiting.  Genitourinary: Negative for dysuria.  Musculoskeletal: Negative for joint swelling.  Skin: Negative for rash.  Neurological: Negative for headaches.  Hematological: Does not bruise/bleed easily.  Psychiatric/Behavioral: Negative for dysphoric mood. The patient is not nervous/anxious.     Past Medical History:  Diagnosis Date  . CAD (coronary artery disease)    a. 07/2017 s/p Ant STEMI/PCI: initially admitted w/ chest pain->Cardiac CTA: Ca2+ 0, >70%LAD stenosis->pt later dev Ant STEMI->Cath: LM nl, LAD 90p (3.5x16 Synergy DES), 66m, LCX nl, RCA nl.  . Diastolic dysfunction    a. 07/2017 Echo: EF 65-70%, mild LVH, Gr1 DD.  Marland Kitchen Dyslipidemia, goal LDL below 70   . Pulmonary nodules    a. 06/2017 Chest CT [facility]): biapical airspace dzs w/ more nodular focus in RUL w/ some associated bronchiectasis and prominent pleural-parenchymal scarring (seen by Pulm - similar findings on CT neck in 01/2016).     Family History  Problem Relation Age of Onset  . CAD Mother      Social History   Socioeconomic History  . Marital status: Married    Spouse name: Not on file  . Number of children: Not on file  . Years of education: Not on file  . Highest education level: Not on file  Occupational History  . 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 Financial resource strain: Not on file  . Food insecurity    Worry: Not on file    Inability: Not on file  . Transportation needs    Medical: Not on file    Non-medical: Not on file  Tobacco Use  . Smoking status: Never Smoker  . Smokeless tobacco: Never Used  Substance and Sexual Activity  . Alcohol use: Yes    Comment: Occasionally.  . Drug use: Never  . Sexual activity: Not  on file  Lifestyle  . Physical activity    Days per week: Not on file    Minutes per session: Not on file  . Stress: Not on file  Relationships  . Social Herbalist on phone: Not on file    Gets together: Not on file    Attends religious service: Not on file    Active member of club or organization: Not on file    Attends meetings of clubs or organizations: Not on file    Relationship status: Not on file  . Intimate partner violence    Fear of current or ex partner: Not on file    Emotionally abused: Not on file    Physically abused: Not on file    Forced sexual activity: Not on file  Other Topics Concern  . Not on file  Social History Narrative   Lives locally with wife.   Just moved from [place]. Has also lived in [place], Rep of [place], has worked with [place]. 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 No malignancy in family.    No Known Allergies   Outpatient Medications Prior to Visit  Medication Sig Dispense Refill  . aspirin 81 MG chewable tablet Chew 1 tablet (81 mg total) by mouth daily. 90 tablet 3  . atorvastatin (LIPITOR) 80 MG tablet Take 1 tablet (80 mg total) by mouth daily at 6 PM. 90 tablet 3  . metoprolol succinate (TOPROL-XL) 25 MG 24 hr tablet TAKE 1/2 TABLET BY MOUTH EVERY DAY 45 tablet 2  . Multiple Vitamin (MULTIVITAMIN) tablet Take 1 tablet by mouth daily.    . nitroGLYCERIN (NITROSTAT) 0.4 MG SL tablet Place 1 tablet (0.4 mg total) under the tongue every 5 (five) minutes as needed for chest pain. 25 tablet 3   No facility-administered medications prior to visit.         Objective:   Physical Exam Vitals:   02/07/19 1623  BP: 128/70  Pulse: 61  Temp: (!) 97 F (36.1 C)  TempSrc: Oral  SpO2: 98%  Weight: 166 lb 6.4 oz (75.5 kg)  Height: 5\' 10"  (1.778 m)   Gen: Pleasant, well-nourished, in no distress,  normal affect  ENT: No lesions,  mouth clear,   oropharynx clear, no postnasal drip  Neck: No JVD, no stridor  Lungs: No use of accessory muscles, clear bilaterally with no crackles, no wheezes  Cardiovascular: RRR, heart sounds normal, no murmur or gallops, no peripheral edema  Musculoskeletal: No deformities, no cyanosis or clubbing  Neuro: alert, non focal  Skin: Warm, no lesions or rash      Assessment & Plan:  Abnormal CT of the chest CT chest consistent with stable scarring, some calcified granulomas.  I do not believe any intervention needs to be made, CTs need to be ordered unless he has a clinical change.  Obstructive lung disease (generalized) ([place]) He has no dyspnea,  minimal cough.  His PFTs are grossly normal but he has a decreased FEV1/FVC ratio that could suggest preclinical obstruction.  No intervention needs to be made at this time but I asked him to follow-up with me if he has worsening symptoms, dyspnea, cough.  Baltazar Apo, MD, PhD 02/07/2019, 5:24 PM [facility] [phone] or if no answer [phone]

## 2019-02-07 NOTE — Patient Instructions (Signed)
Your CT scan of the chest is stable compared with your priors. No change in scarring, no new concerning findings.  This is good news.  You should not need any repeat CT scans unless you develop new symptoms. Your pulmonary function testing is overall normal. There may be a suggestion of an early (sub-clinical) asthma pattern present. Please call us to be seen if your breathing changes in any way.

## 2019-02-07 NOTE — Assessment & Plan Note (Signed)
He has no dyspnea, minimal cough.  His PFTs are grossly normal but he has a decreased FEV1/FVC ratio that could suggest preclinical obstruction.  No intervention needs to be made at this time but I asked him to follow-up with me if he has worsening symptoms, dyspnea, cough.

## 2019-02-07 NOTE — Assessment & Plan Note (Signed)
CT chest consistent with stable scarring, some calcified granulomas.  I do not believe any intervention needs to be made, CTs need to be ordered unless he has a clinical change.

## 2019-04-23 ENCOUNTER — Ambulatory Visit: Payer: Managed Care, Other (non HMO) | Attending: Internal Medicine

## 2019-04-23 DIAGNOSIS — Z23 Encounter for immunization: Secondary | ICD-10-CM

## 2019-04-23 NOTE — Progress Notes (Signed)
   Covid-19 Vaccination Clinic  Name:  Casey Cummings    MRN: [id number] DOB: [birth year]  04/23/2019  Casey Cummings was observed post Covid-19 immunization for 15 minutes without incidence. He was provided with Vaccine Information Sheet and instruction to access the V-Safe system.   Casey Cummings was instructed to call 911 with any severe reactions post vaccine: Marland Kitchen Difficulty breathing  . Swelling of your face and throat  . A fast heartbeat  . A bad rash all over your body  . Dizziness and weakness    Immunizations Administered    Name Date Dose VIS Date Route   Pfizer COVID-19 Vaccine 04/23/2019  8:34 AM 0.3 mL 02/17/2019 Intramuscular   Manufacturer: [place]   Lot: [id number]   [place]: [id number]

## 2019-05-17 ENCOUNTER — Ambulatory Visit: Payer: Managed Care, Other (non HMO) | Attending: Internal Medicine

## 2019-05-17 DIAGNOSIS — Z23 Encounter for immunization: Secondary | ICD-10-CM

## 2019-05-17 NOTE — Progress Notes (Signed)
   Covid-19 Vaccination Clinic  Name:  Antario Delfino    MRN: [id number] DOB: Dec 28, 1955  05/17/2019  Mr. Borstad was observed post Covid-19 immunization for 15 minutes without incident. He was provided with Vaccine Information Sheet and instruction to access the V-Safe system.   Mr. Antilla was instructed to call 911 with any severe reactions post vaccine: Marland Kitchen Difficulty breathing  . Swelling of face and throat  . A fast heartbeat  . A bad rash all over body  . Dizziness and weakness   Immunizations Administered    Name Date Dose VIS Date Route   Pfizer COVID-19 Vaccine 05/17/2019  1:08 PM 0.3 mL 02/17/2019 Intramuscular   Manufacturer: [place]   Lot: [id number]   [place]: [id number]

## 2019-07-16 DIAGNOSIS — I2510 Atherosclerotic heart disease of native coronary artery without angina pectoris: Secondary | ICD-10-CM | POA: Insufficient documentation

## 2019-07-16 NOTE — Progress Notes (Signed)
Cardiology Office Note   Date:  07/17/2019   ID:  Casey Cummings, DOB June 14, 1955, MRN [id number]  PCP:  Francesca Oman, DO  Cardiologist:   Minus Breeding, MD   Chief Complaint  Patient presents with  . Shoulder Pain      History of Present Illness: Casey Cummings is a 64 y.o. male who presents for follow up of NSTEMI in May of 2019.  He had a DES to the LAD.  Since I last saw him he he has had some muscle aches.  Has had some of the right leg and some on his right shoulder.  He has normal mild diffuse soreness.  He was switched from Lipitor to Crestor and he did not really notice much of a difference with this.  He has had 2 dizzy episodes.  These happened while he was seated and doing nothing and he became very weak and dizzy.  However, this was a while ago and has not had any recurrence of this and he is not describing tachypalpitations.  He has had some vague chest discomfort across his chest episodically and maybe with activities.  This is not really reminiscent of his previous heart attack.  He is not having any new shortness of breath, PND or orthopnea.  He has had no syncope.  He does do some walking for exercise.  Past Medical History:  Diagnosis Date  . CAD (coronary artery disease)    a. 07/2017 s/p Ant STEMI/PCI: initially admitted w/ chest pain->Cardiac CTA: Ca2+ 0, >70%LAD stenosis->pt later dev Ant STEMI->Cath: LM nl, LAD 90p (3.5x16 Synergy DES), 75m, LCX nl, RCA nl.  . Diastolic dysfunction    a. 07/2017 Echo: EF 65-70%, mild LVH, Gr1 DD.  Marland Kitchen Dyslipidemia, goal LDL below 70   . Pulmonary nodules    a. 06/2017 Chest CT [facility]): biapical airspace dzs w/ more nodular focus in RUL w/ some associated bronchiectasis and prominent pleural-parenchymal scarring (seen by Pulm - similar findings on CT neck in 01/2016).    Past Surgical History:  Procedure Laterality Date  . CORONARY/GRAFT ACUTE MI REVASCULARIZATION N/A 07/08/2017   Procedure: CORONARY/GRAFT ACUTE MI  REVASCULARIZATION;  Surgeon: Leonie Man, MD;  Location: [facility];  Service: Cardiovascular;  Laterality: N/A;  . LEFT HEART CATH AND CORONARY ANGIOGRAPHY N/A 07/08/2017   Procedure: LEFT HEART CATH AND CORONARY ANGIOGRAPHY;  Surgeon: Leonie Man, MD;  Location: [facility];  Service: Cardiovascular;  Laterality: N/A;     Current Outpatient Medications  Medication Sig Dispense Refill  . aspirin 81 MG chewable tablet Chew 1 tablet (81 mg total) by mouth daily. 90 tablet 3  . Ergocalciferol (VITAMIN D2 PO) Take by mouth.    . metoprolol succinate (TOPROL-XL) 25 MG 24 hr tablet TAKE 1/2 TABLET BY MOUTH EVERY DAY 45 tablet 2  . Multiple Vitamin (MULTIVITAMIN) tablet Take 1 tablet by mouth daily.    . nitroGLYCERIN (NITROSTAT) 0.4 MG SL tablet Place 1 tablet (0.4 mg total) under the tongue every 5 (five) minutes as needed for chest pain. 25 tablet 3  . rosuvastatin (CRESTOR) 40 MG tablet Take 40 mg by mouth daily.     No current facility-administered medications for this visit.    Allergies:   Patient has no known allergies.    Social History:  The patient  reports that he has never smoked. He has never used smokeless tobacco. He reports current alcohol use. He reports that he does not use  drugs.   Family History:  The patient's family history includes CAD in his mother.    ROS:  Please see the history of present illness.   Otherwise, review of systems are positive for none.   All other systems are reviewed and negative.    PHYSICAL EXAM: VS:  BP 124/76   Pulse 60   Temp (!) 97.4 F (36.3 C)   Resp 16   Ht 5\' 10"  (1.778 m)   Wt 168 lb (76.2 kg)   SpO2 98%   BMI 24.11 kg/m  , BMI Body mass index is 24.11 kg/m. GENERAL:  Well appearing NECK:  No jugular venous distention, waveform within normal limits, carotid upstroke brisk and symmetric, no bruits, no thyromegaly LUNGS:  Clear to auscultation bilaterally CHEST:  Unremarkable HEART:  PMI not displaced  or sustained,S1 and S2 within normal limits, no S3, no S4, no clicks, no rubs, no murmurs ABD:  Flat, positive bowel sounds normal in frequency in pitch, no bruits, no rebound, no guarding, no midline pulsatile mass, no hepatomegaly, no splenomegaly EXT:  2 plus pulses throughout, no edema, no cyanosis no clubbing   EKG:  EKG is ordered today. The ekg ordered today demonstrates sinus rhythm, rate 59, axis within normal limits, intervals within normal limits, no acute ST-T wave changes.   Recent Labs: No results found for requested labs within last 8760 hours.    Lipid Panel    Component Value Date/Time   CHOL 103 11/16/2017 0819   TRIG 90 11/16/2017 0819   HDL 47 11/16/2017 0819   CHOLHDL 2.2 11/16/2017 0819   CHOLHDL 3.6 07/08/2017 1244   VLDL 25 07/08/2017 1244   LDLCALC 38 11/16/2017 0819      Wt Readings from Last 3 Encounters:  07/17/19 168 lb (76.2 kg)  02/07/19 166 lb 6.4 oz (75.5 kg)  07/25/18 160 lb (72.6 kg)      Other studies Reviewed: Additional studies/ records that were reviewed today include:  EKG. Review of the above records demonstrates:  Please see elsewhere in the note.     ASSESSMENT AND PLAN:  CAD:  The patient has some vague chest discomfort and given his previous history I am going to bring him back for a POET (Plain Old Exercise Treadmill)  DYSLIPIDEMIA :  He switched to Crestor and I am not sure that he still not getting some discomfort related to the statin.  He is going to talk to his primary maybe an orthopedist about his shoulder discomfort.  If there is not an alternative explanation I would suggest a statin free.  And if he has resolution of his aches and pains and he might need PCSK9 inhibitor.  He and I had a long conversation about this.  COVID EDUCATION: He has gotten his vaccine.   Current medicines are reviewed at length with the patient today.  The patient does not have concerns regarding medicines.  The following changes have  been made:  no change  Labs/ tests ordered today include:   Orders Placed This Encounter  Procedures  . Lipid panel  . Hepatic function panel  . EXERCISE TOLERANCE TEST (ETT)     Disposition:   FU with me in one year.    Signed, Minus Breeding, MD  07/17/2019 12:28 PM    [facility]

## 2019-07-17 ENCOUNTER — Ambulatory Visit (INDEPENDENT_AMBULATORY_CARE_PROVIDER_SITE_OTHER): Payer: Managed Care, Other (non HMO) | Admitting: Cardiology

## 2019-07-17 ENCOUNTER — Encounter: Payer: Self-pay | Admitting: Cardiology

## 2019-07-17 ENCOUNTER — Other Ambulatory Visit: Payer: Self-pay

## 2019-07-17 VITALS — BP 124/76 | HR 60 | Temp 97.4°F | Resp 16 | Ht 70.0 in | Wt 168.0 lb

## 2019-07-17 DIAGNOSIS — E785 Hyperlipidemia, unspecified: Secondary | ICD-10-CM | POA: Diagnosis not present

## 2019-07-17 DIAGNOSIS — I2510 Atherosclerotic heart disease of native coronary artery without angina pectoris: Secondary | ICD-10-CM | POA: Diagnosis not present

## 2019-07-17 DIAGNOSIS — R918 Other nonspecific abnormal finding of lung field: Secondary | ICD-10-CM

## 2019-07-17 DIAGNOSIS — R072 Precordial pain: Secondary | ICD-10-CM

## 2019-07-17 DIAGNOSIS — Z7189 Other specified counseling: Secondary | ICD-10-CM

## 2019-07-17 NOTE — Patient Instructions (Signed)
Medication Instructions:  Lipitor removed from medication list *If you need a refill on your cardiac medications before your next appointment, please call your pharmacy*  Lab Work: You will need a Covid Screening 3 days prior to your exercise tolerance test. You will need to self quarantine after the screening until your procedure.This is a Drive Up Visit at the [facility] [address] [place]. Someone will direct you to the appropriate testing line. Stay in your car and someone will be with you shortly  Your physician recommends that you return for lab work on the day of your exercise test. (Lipids/Liver)  Testing/Procedures: Your physician has requested that you have an exercise tolerance test. For further information please visit [email or web address]. Please also follow instruction sheet, as given.  Follow-Up: At [facility], you and your health needs are our priority.  As part of our continuing mission to provide you with exceptional heart care, we have created designated Provider Care Teams.  These Care Teams include your primary Cardiologist (physician) and Advanced Practice Providers (APPs -  Physician Assistants and Nurse Practitioners) who all work together to provide you with the care you need, when you need it.  Your next appointment:   12 month(s)  You will receive a reminder letter in the mail two months in advance. If you don't receive a letter, please call our office to schedule the follow-up appointment.  The format for your next appointment:   In Person  Provider:   Minus Breeding, MD

## 2019-08-02 ENCOUNTER — Other Ambulatory Visit (INDEPENDENT_AMBULATORY_CARE_PROVIDER_SITE_OTHER): Payer: Managed Care, Other (non HMO)

## 2019-08-02 DIAGNOSIS — I2510 Atherosclerotic heart disease of native coronary artery without angina pectoris: Secondary | ICD-10-CM

## 2019-08-02 DIAGNOSIS — R072 Precordial pain: Secondary | ICD-10-CM

## 2019-08-02 NOTE — Progress Notes (Signed)
ekg entered for 07/17/2019

## 2019-08-10 ENCOUNTER — Telehealth (HOSPITAL_COMMUNITY): Payer: Self-pay

## 2019-08-10 NOTE — Telephone Encounter (Signed)
Encounter complete. 

## 2019-08-12 ENCOUNTER — Other Ambulatory Visit (HOSPITAL_COMMUNITY)
Admission: RE | Admit: 2019-08-12 | Discharge: 2019-08-12 | Disposition: A | Discharge status: Home / Self Care | Payer: Managed Care, Other (non HMO) | Source: Ambulatory Visit | Attending: Cardiology | Admitting: Cardiology

## 2019-08-12 DIAGNOSIS — Z20822 Contact with and (suspected) exposure to covid-19: Secondary | ICD-10-CM | POA: Diagnosis not present

## 2019-08-12 DIAGNOSIS — Z01812 Encounter for preprocedural laboratory examination: Secondary | ICD-10-CM | POA: Diagnosis present

## 2019-08-12 LAB — SARS CORONAVIRUS 2 (TAT 6-24 HRS): SARS Coronavirus 2: NEGATIVE

## 2019-08-15 ENCOUNTER — Ambulatory Visit (HOSPITAL_COMMUNITY)
Admission: RE | Admit: 2019-08-15 | Discharge: 2019-08-15 | Disposition: A | Discharge status: Home / Self Care | Payer: Managed Care, Other (non HMO) | Source: Ambulatory Visit | Attending: Cardiovascular Disease | Admitting: Cardiovascular Disease

## 2019-08-15 ENCOUNTER — Other Ambulatory Visit: Payer: Self-pay

## 2019-08-15 DIAGNOSIS — I2510 Atherosclerotic heart disease of native coronary artery without angina pectoris: Secondary | ICD-10-CM | POA: Diagnosis not present

## 2019-08-15 DIAGNOSIS — R072 Precordial pain: Secondary | ICD-10-CM | POA: Insufficient documentation

## 2019-08-15 LAB — LIPID PANEL
Chol/HDL Ratio: 2.1 ratio (ref 0.0–5.0)
Cholesterol, Total: 113 mg/dL (ref 100–199)
HDL: 53 mg/dL (ref >39)
LDL Chol Calc (NIH): 44 mg/dL (ref 0–99)
Triglycerides: 81 mg/dL (ref 0–149)
VLDL Cholesterol Cal: 16 mg/dL (ref 5–40)

## 2019-08-15 LAB — EXERCISE TOLERANCE TEST
Estimated workload: 10 METS
Exercise duration (min): 7 min
Exercise duration (sec): 59 s
MPHR: 157 {beats}/min
Peak HR: 153 {beats}/min
Percent HR: 97 %
Rest HR: 75 {beats}/min

## 2019-08-15 LAB — HEPATIC FUNCTION PANEL
ALT: 48 IU/L — ABNORMAL HIGH (ref 0–44)
AST: 46 IU/L — ABNORMAL HIGH (ref 0–40)
Albumin: 4.4 g/dL (ref 3.8–4.8)
Alkaline Phosphatase: 80 IU/L (ref 48–121)
Bilirubin Total: 0.5 mg/dL (ref 0.0–1.2)
Bilirubin, Direct: 0.18 mg/dL (ref 0.00–0.40)
Total Protein: 6.5 g/dL (ref 6.0–8.5)

## 2019-08-25 IMAGING — CT CT OUTSIDE FILMS CHEST
2 of 5 series · 16 of 36 positions shown, 20 images · IV contrast (OMNIPAQUE)
Comparison: none

[Series 4: thins · axial · 0.70mm/px · z∈[-318,-11]mm · 13 of 278 slices shown, 17 images]
[im 16/278  mediastinal]
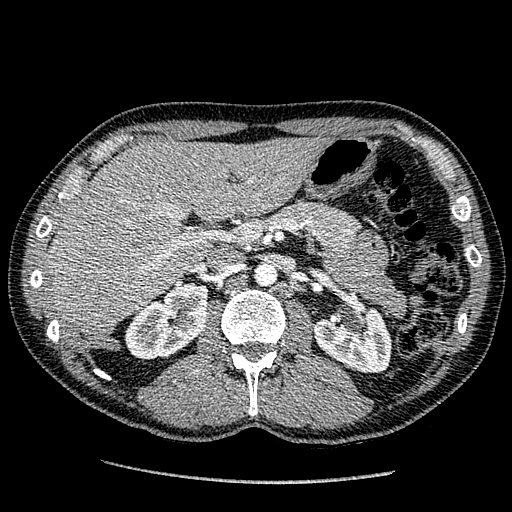
[im 16/278  lung]
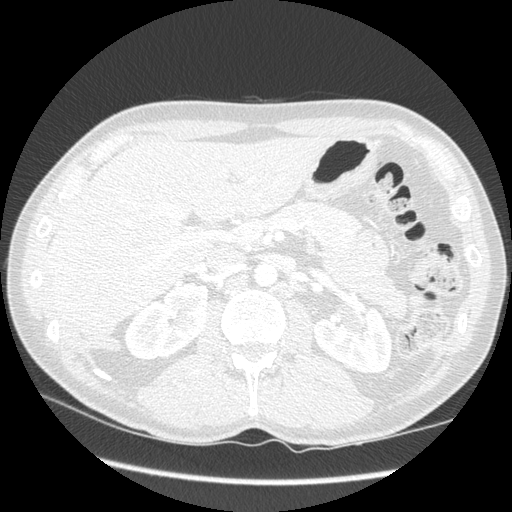
[im 31/278  lung]
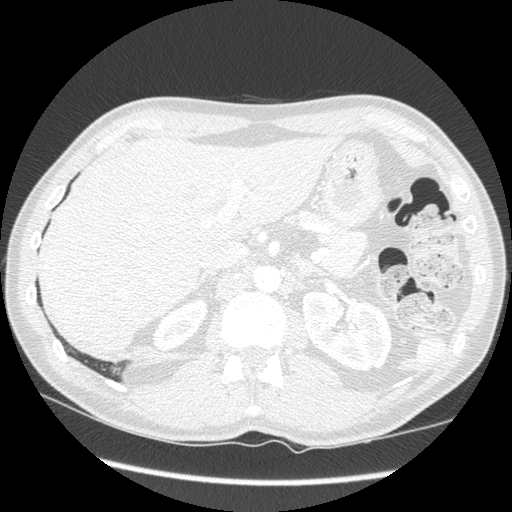
[im 62/278  lung]
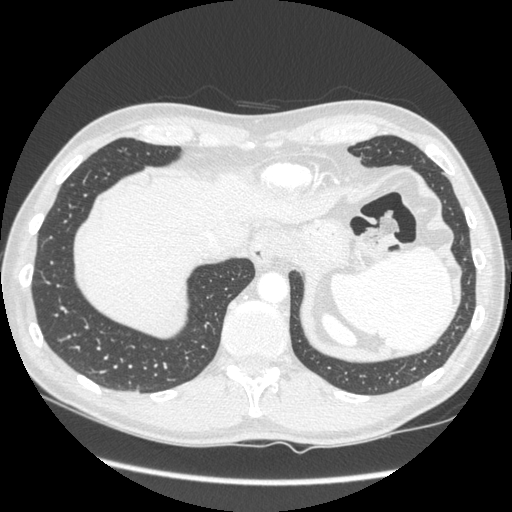
[im 77/278  lung]
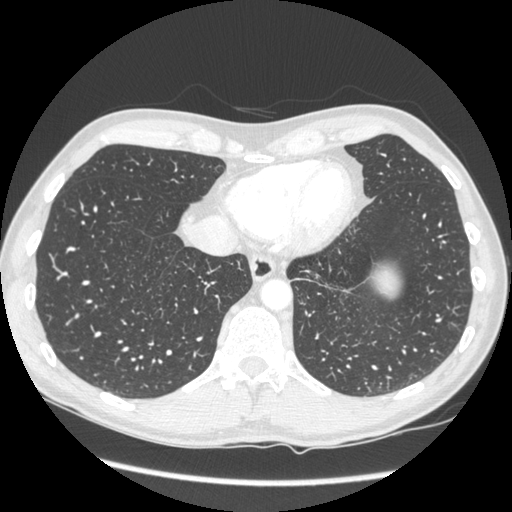
[im 93/278  mediastinal]
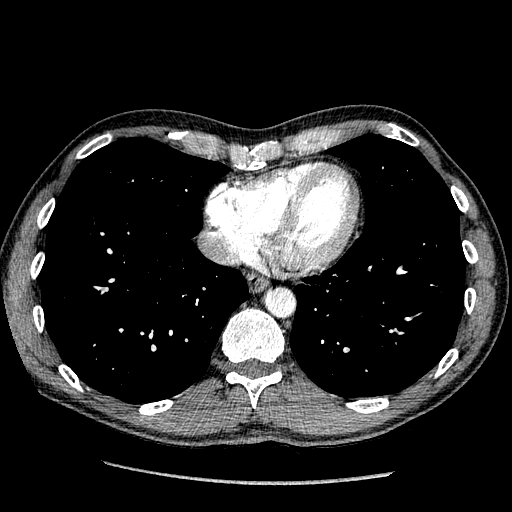
[im 93/278  lung]
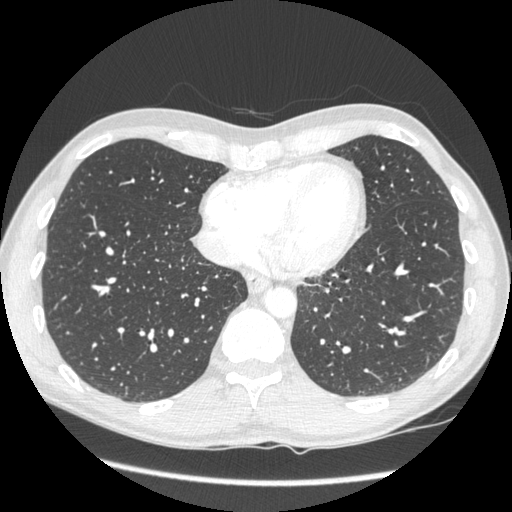
[im 124/278  lung]
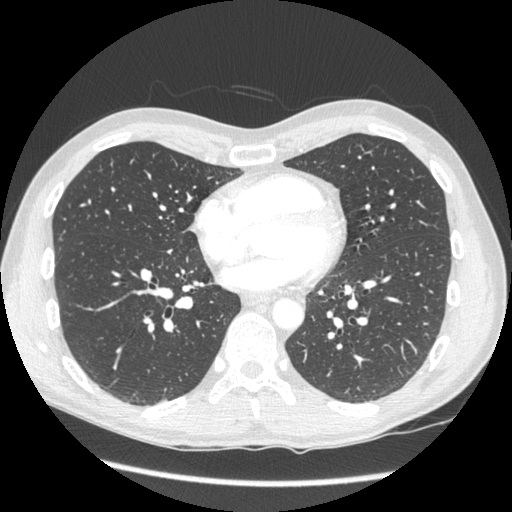
[im 139/278  lung]
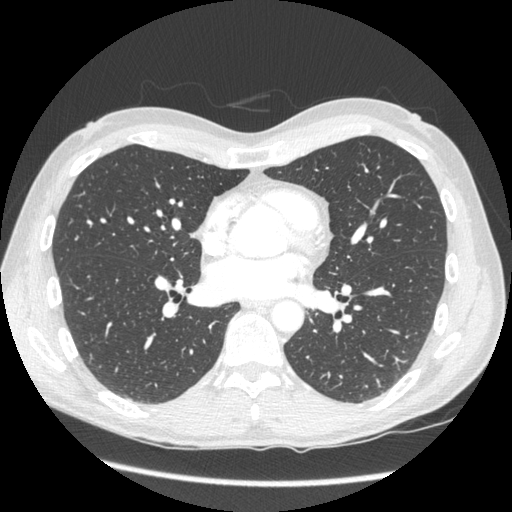
[im 154/278  lung]
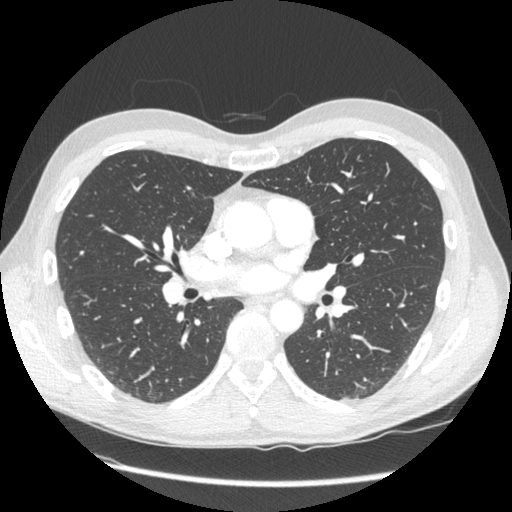
[im 185/278  mediastinal]
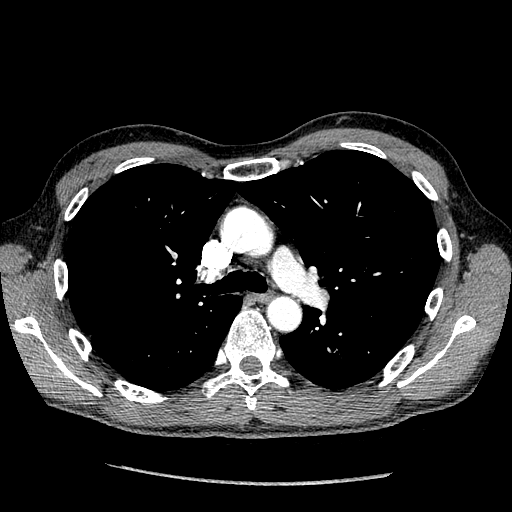
[im 185/278  lung]
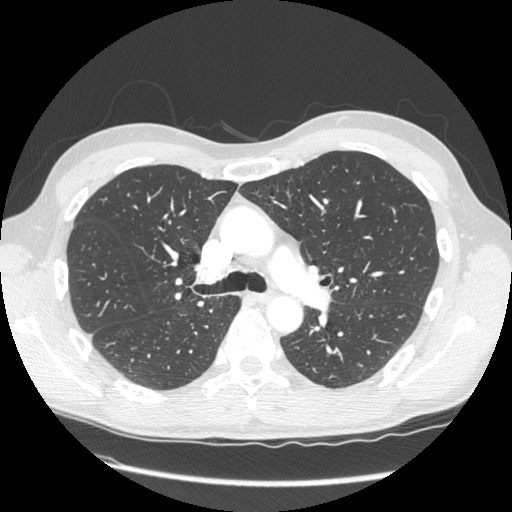
[im 201/278  lung]
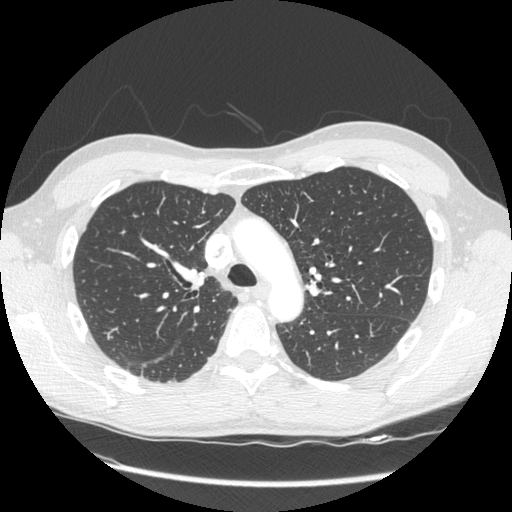
[im 216/278  lung]
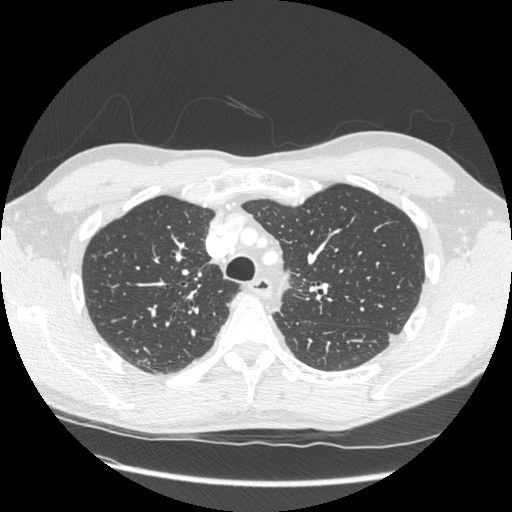
[im 247/278  lung]
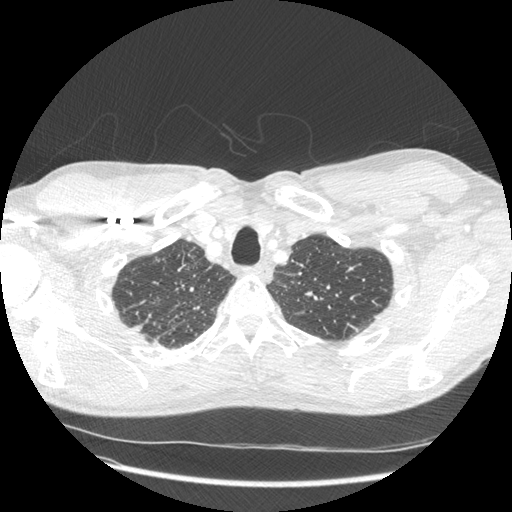
[im 262/278  mediastinal]
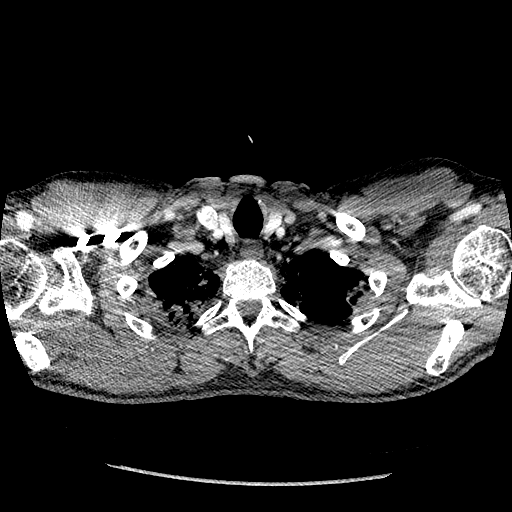
[im 262/278  lung]
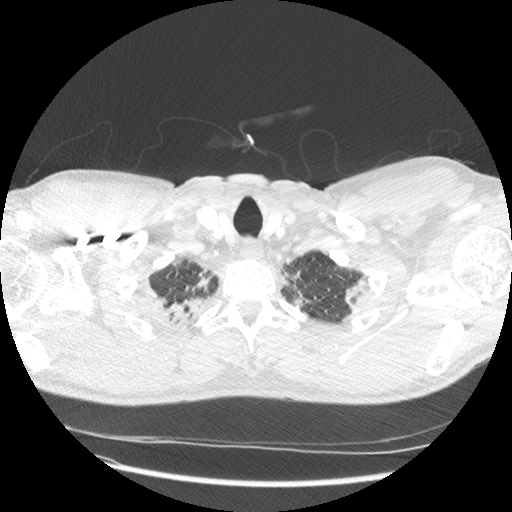

[Series 602: sag chest 3mm · sagittal · 0.70mm/px · 3 of 121 slices shown]
[im 25/121  lung]
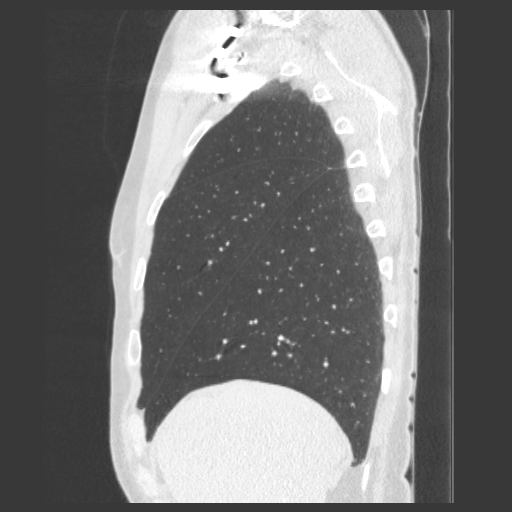
[im 49/121  lung]
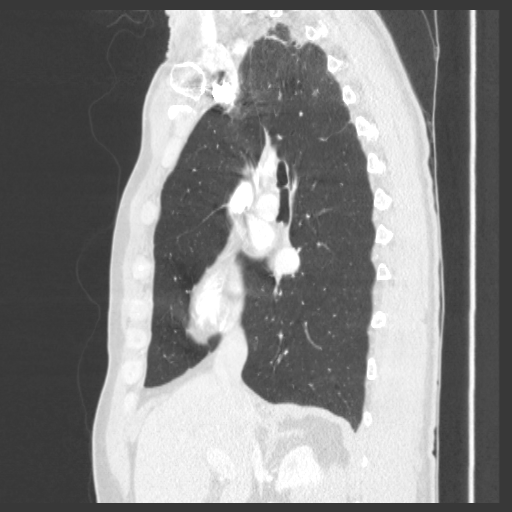
[im 73/121  lung]
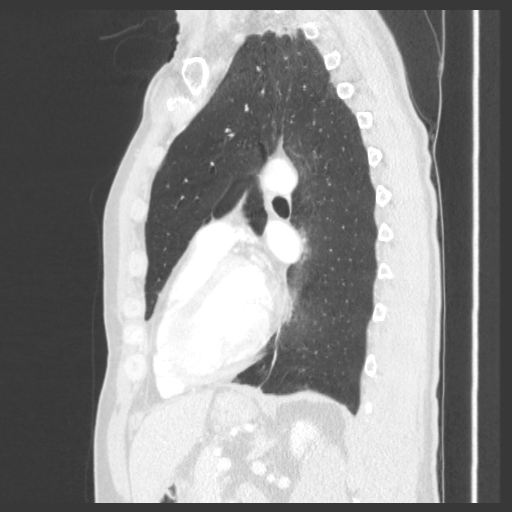

[16 of 36 positions shown; findings below may reference images not displayed]

Canned report from images found in remote index.

Refer to host system for actual result text.

## 2019-08-29 ENCOUNTER — Other Ambulatory Visit: Payer: Self-pay | Admitting: Cardiology

## 2019-10-24 ENCOUNTER — Other Ambulatory Visit: Payer: Self-pay

## 2019-10-24 MED ORDER — NITROGLYCERIN 0.4 MG SL SUBL: 0.4000 mg | SUBLINGUAL_TABLET | SUBLINGUAL | 3 refills | Status: AC | PRN

## 2019-11-22 ENCOUNTER — Ambulatory Visit: Payer: Managed Care, Other (non HMO) | Admitting: Emergency Medicine

## 2020-01-06 ENCOUNTER — Ambulatory Visit: Payer: Managed Care, Other (non HMO) | Attending: Internal Medicine

## 2020-01-06 DIAGNOSIS — Z23 Encounter for immunization: Secondary | ICD-10-CM

## 2020-01-06 NOTE — Progress Notes (Signed)
   Covid-19 Vaccination Clinic  Name:  Casey Cummings    MRN: [id number] DOB: [birth year]  01/06/2020  [name] was observed post Covid-19 immunization for 15 minutes without incident. He was provided with Vaccine Information Sheet and instruction to access the V-Safe system.   [name] was instructed to call 911 with any severe reactions post vaccine: Marland Kitchen Difficulty breathing  . Swelling of face and throat  . A fast heartbeat  . A bad rash all over body  . Dizziness and weakness

## 2020-01-11 ENCOUNTER — Ambulatory Visit (INDEPENDENT_AMBULATORY_CARE_PROVIDER_SITE_OTHER): Payer: Managed Care, Other (non HMO) | Admitting: Otolaryngology

## 2020-01-11 ENCOUNTER — Encounter (INDEPENDENT_AMBULATORY_CARE_PROVIDER_SITE_OTHER): Payer: Self-pay | Admitting: Otolaryngology

## 2020-01-11 ENCOUNTER — Other Ambulatory Visit: Payer: Self-pay

## 2020-01-11 VITALS — Temp 96.8°F

## 2020-01-11 DIAGNOSIS — H6122 Impacted cerumen, left ear: Secondary | ICD-10-CM | POA: Diagnosis not present

## 2020-01-11 NOTE — Progress Notes (Addendum)
HPI: Casey Cummings is a 64 y.o. male who presents for evaluation of ear canals.  A couple weeks ago he had his right ear cleaned out down in the [place] by ENT and had a lot of pain discomfort associated with this and was placed on antibiotic eardrops (ciprofloxacin) and recommended follow-up when he got home by an ENT doctor..  Past Medical History:  Diagnosis Date  . CAD (coronary artery disease)    a. 07/2017 s/p Ant STEMI/PCI: initially admitted w/ chest pain->Cardiac CTA: Ca2+ 0, >70%LAD stenosis->pt later dev Ant STEMI->Cath: LM nl, LAD 90p (3.5x16 Synergy DES), 79m, LCX nl, RCA nl.  . Diastolic dysfunction    a. 07/2017 Echo: EF 65-70%, mild LVH, Gr1 DD.  Marland Kitchen Dyslipidemia, goal LDL below 70   . Pulmonary nodules    a. 06/2017 Chest CT [facility]): biapical airspace dzs w/ more nodular focus in RUL w/ some associated bronchiectasis and prominent pleural-parenchymal scarring (seen by Pulm - similar findings on CT neck in 01/2016).   Past Surgical History:  Procedure Laterality Date  . CORONARY/GRAFT ACUTE MI REVASCULARIZATION N/A 07/08/2017   Procedure: CORONARY/GRAFT ACUTE MI REVASCULARIZATION;  Surgeon: Leonie Man, MD;  Location: [place] [facility];  Service: Cardiovascular;  Laterality: N/A;  . LEFT HEART CATH AND CORONARY ANGIOGRAPHY N/A 07/08/2017   Procedure: LEFT HEART CATH AND CORONARY ANGIOGRAPHY;  Surgeon: Leonie Man, MD;  Location: [place] [facility];  Service: Cardiovascular;  Laterality: N/A;   Social History   Socioeconomic History  . Marital status: Married    Spouse name: Not on file  . Number of children: Not on file  . Years of education: Not on file  . Highest education level: Not on file  Occupational History  . 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 Smoking status: Never Smoker  . Smokeless tobacco: Never Used  Substance and Sexual Activity  . Alcohol use: Yes    Comment: Occasionally.  . Drug use: Never   . Sexual activity: Not on file  Other Topics Concern  . Not on file  Social History Narrative   Lives locally with wife.   Social Determinants of Health   Financial Resource Strain:   . Difficulty of Paying Living Expenses: Not on file  Food Insecurity:   . Worried About Charity fundraiser in the Last Year: Not on file  . Ran Out of Food in the Last Year: Not on file  Transportation Needs:   . Lack of Transportation (Medical): Not on file  . Lack of Transportation (Non-Medical): Not on file  Physical Activity:   . Days of Exercise per Week: Not on file  . Minutes of Exercise per Session: Not on file  Stress:   . Feeling of Stress : Not on file  Social Connections:   . Frequency of Communication with Friends and Family: Not on file  . Frequency of Social Gatherings with Friends and Family: Not on file  . Attends Religious Services: Not on file  . Active Member of Clubs or Organizations: Not on file  . Attends Archivist Meetings: Not on file  . Marital Status: Not on file   Family History  Problem Relation Age of Onset  . CAD Mother    No Known Allergies Prior to Admission medications   Medication Sig Start Date End Date Taking? Authorizing Provider  aspirin 81 MG chewable tablet Chew 1 tablet (81 mg total) by  mouth daily. 07/10/17  Yes Kroeger, Lorelee Cover., PA-C  Ergocalciferol (VITAMIN D2 PO) Take by mouth.   Yes [provider]  metoprolol succinate (TOPROL-XL) 25 MG 24 hr tablet TAKE 1/2 TABLET BY MOUTH EVERY DAY 08/29/19  Yes Minus Breeding, MD  Multiple Vitamin (MULTIVITAMIN) tablet Take 1 tablet by mouth daily.   Yes [provider]  nitroGLYCERIN (NITROSTAT) 0.4 MG SL tablet Place 1 tablet (0.4 mg total) under the tongue every 5 (five) minutes as needed for chest pain. 10/24/19  Yes Minus Breeding, MD  rosuvastatin (CRESTOR) 40 MG tablet Take 40 mg by mouth daily. 06/17/19  Yes [provider]     Positive ROS: Otherwise  negative  All other systems have been reviewed and were otherwise negative with the exception of those mentioned in the HPI and as above.  Physical Exam: Constitutional: Alert, well-appearing, no acute distress Ears: External ears without lesions or tenderness. Ear canals right ear canal is clear with some dry white residue ciprofloxacin eardrops in the ear canal with no signs of infection.  The TM is clear.  No wax buildup.  However the left ear canal is completely occluded with cerumen that was removed with forceps curettes and suction.  The left ear canal left TM are clear with no signs of infection.. Nasal: External nose without lesions. Clear nasal passages Oral: Oropharynx clear. Neck: No palpable adenopathy or masses Respiratory: Breathing comfortably  Skin: No facial/neck lesions or rash noted.  Cerumen impaction removal  Date/Time: 01/11/2020 4:33 PM Performed by: Rozetta Nunnery, MD Authorized by: Rozetta Nunnery, MD   Consent:    Consent obtained:  Verbal   Consent given by:  Patient   Risks discussed:  Pain and bleeding Procedure details:    Location:  L ear   Procedure type: curette, suction and forceps   Post-procedure details:    Inspection:  TM intact and canal normal   Hearing quality:  Improved   Patient tolerance of procedure:  Tolerated well, no immediate complications Comments:     Right ear canal is clear with no signs of infection.  Right TM is clear.  Left ear canal was completely occluded with cerumen that was removed with curette forceps and suction.  Left TM was clear.    Assessment: Resolved right external otitis. Left ear canal cerumen impaction that was cleaned in the office.  Plan: Left ear canal was cleaned in the office today.  Recommended not using Q-tips in his ears and follow-up every 2 to 3 years to have his ear canals cleaned.  Radene Journey, MD

## 2020-03-19 ENCOUNTER — Observation Stay (HOSPITAL_COMMUNITY)
Admission: EM | Admit: 2020-03-19 | Discharge: 2020-03-21 | Disposition: A | Discharge status: Home / Self Care | Payer: Managed Care, Other (non HMO) | Attending: Family Medicine | Admitting: Family Medicine

## 2020-03-19 ENCOUNTER — Emergency Department (HOSPITAL_COMMUNITY): Payer: Managed Care, Other (non HMO)

## 2020-03-19 ENCOUNTER — Encounter (HOSPITAL_COMMUNITY): Payer: Self-pay | Admitting: Emergency Medicine

## 2020-03-19 ENCOUNTER — Other Ambulatory Visit: Payer: Self-pay

## 2020-03-19 DIAGNOSIS — Z20822 Contact with and (suspected) exposure to covid-19: Secondary | ICD-10-CM | POA: Diagnosis not present

## 2020-03-19 DIAGNOSIS — I2510 Atherosclerotic heart disease of native coronary artery without angina pectoris: Secondary | ICD-10-CM | POA: Diagnosis not present

## 2020-03-19 DIAGNOSIS — I214 Non-ST elevation (NSTEMI) myocardial infarction: Principal | ICD-10-CM | POA: Insufficient documentation

## 2020-03-19 DIAGNOSIS — R079 Chest pain, unspecified: Secondary | ICD-10-CM | POA: Diagnosis present

## 2020-03-19 DIAGNOSIS — Z7982 Long term (current) use of aspirin: Secondary | ICD-10-CM | POA: Insufficient documentation

## 2020-03-19 LAB — CBC
HCT: 48.3 % (ref 39.0–52.0)
Hemoglobin: 16.1 g/dL (ref 13.0–17.0)
MCH: 31.6 pg (ref 26.0–34.0)
MCHC: 33.3 g/dL (ref 30.0–36.0)
MCV: 94.9 fL (ref 80.0–100.0)
Platelets: 204 10*3/uL (ref 150–400)
RBC: 5.09 MIL/uL (ref 4.22–5.81)
RDW: 11.6 % (ref 11.5–15.5)
WBC: 5.7 10*3/uL (ref 4.0–10.5)
nRBC: 0 % (ref 0.0–0.2)

## 2020-03-19 LAB — BASIC METABOLIC PANEL
Anion gap: 10 (ref 5–15)
BUN: 14 mg/dL (ref 8–23)
CO2: 24 mmol/L (ref 22–32)
Calcium: 9.4 mg/dL (ref 8.9–10.3)
Chloride: 104 mmol/L (ref 98–111)
Creatinine, Ser: 1.37 mg/dL — ABNORMAL HIGH (ref 0.61–1.24)
GFR, Estimated: 58 mL/min — ABNORMAL LOW (ref >60)
Glucose, Bld: 104 mg/dL — ABNORMAL HIGH (ref 70–99)
Potassium: 3.7 mmol/L (ref 3.5–5.1)
Sodium: 138 mmol/L (ref 135–145)

## 2020-03-19 LAB — TROPONIN I (HIGH SENSITIVITY): Troponin I (High Sensitivity): 3 ng/L (ref <18)

## 2020-03-19 NOTE — ED Provider Notes (Signed)
[facility] EMERGENCY DEPARTMENT Provider Note   CSN: [id number] Arrival date & time: 03/19/20  2222     History Chief Complaint  Patient presents with  . Chest Pain    Casey Cummings is a 65 y.o. male.  Patient presents to the emergency department for evaluation of chest pain.  Patient does have a cardiac history, having had LAD stenting for a STEMI in 2019.  Patient reports that he developed substernal chest burning earlier today that was moderate to severe, prompting him to take nitroglycerin.  This occurred while he was at work.  Chest discomfort resolved and he went home.  This evening, he had recurrence of the symptoms while at rest watching TV.  He took another nitroglycerin and the pain has resolved.  Patient reports that he has not used any nitroglycerin since his stenting.  He has, however, noticed over the last week or so that he has been having fluttering intermittently in his chest and also some intermittent mild burning discomfort in the chest, sometimes keeping him up at night.        Past Medical History:  Diagnosis Date  . CAD (coronary artery disease)    a. 07/2017 s/p Ant STEMI/PCI: initially admitted w/ chest pain->Cardiac CTA: Ca2+ 0, >70%LAD stenosis->pt later dev Ant STEMI->Cath: LM nl, LAD 90p (3.5x16 Synergy DES), 62m, LCX nl, RCA nl.  . Diastolic dysfunction    a. 07/2017 Echo: EF 65-70%, mild LVH, Gr1 DD.  Marland Kitchen Dyslipidemia, goal LDL below 70   . Pulmonary nodules    a. 06/2017 Chest CT [facility]): biapical airspace dzs w/ more nodular focus in RUL w/ some associated bronchiectasis and prominent pleural-parenchymal scarring (seen by Pulm - similar findings on CT neck in 01/2016).    Patient Active Problem List   Diagnosis Date Noted  . Coronary artery disease involving native coronary artery of native heart without angina pectoris 07/16/2019  . Obstructive lung disease (generalized) ([place]) 02/07/2019  . Medication management 07/25/2018  .  Educated about COVID-19 virus infection 07/25/2018  . Abnormal CT of the chest 08/04/2017  . Acute anterior wall MI ([place]) 07/09/2017  . Dyslipidemia 07/09/2017  . Acute ST elevation myocardial infarction (STEMI) of anterior wall ([place])   . Abnormal findings on diagnostic imaging of cardiovascular system   . Chest pain 07/07/2017  . Pulmonary nodule 07/07/2017    Past Surgical History:  Procedure Laterality Date  . CORONARY/GRAFT ACUTE MI REVASCULARIZATION N/A 07/08/2017   Procedure: CORONARY/GRAFT ACUTE MI REVASCULARIZATION;  Surgeon: Leonie Man, MD;  Location: [place] [facility];  Service: Cardiovascular;  Laterality: N/A;  . LEFT HEART CATH AND CORONARY ANGIOGRAPHY N/A 07/08/2017   Procedure: LEFT HEART CATH AND CORONARY ANGIOGRAPHY;  Surgeon: Leonie Man, MD;  Location: [place] [facility];  Service: Cardiovascular;  Laterality: N/A;       Family History  Problem Relation Age of Onset  . CAD Mother     Social History   Tobacco Use  . Smoking status: Never Smoker  . Smokeless tobacco: Never Used  Substance Use Topics  . Alcohol use: Yes    Comment: Occasionally.  . Drug use: Never    Home Medications Prior to Admission medications   Medication Sig Start Date End Date Taking? Authorizing Provider  aspirin 81 MG chewable tablet Chew 1 tablet (81 mg total) by mouth daily. 07/10/17  Yes Kroeger, Lorelee Cover., PA-C  Ergocalciferol (VITAMIN D2 PO) Take 2,000 Units by mouth every other day.  Yes [provider]  metoprolol succinate (TOPROL-XL) 25 MG 24 hr tablet TAKE 1/2 TABLET BY MOUTH EVERY DAY Patient taking differently: Take 12.5 mg by mouth at bedtime. 08/29/19  Yes Minus Breeding, MD  Multiple Vitamin (MULTIVITAMIN) tablet Take 1 tablet by mouth daily.   Yes [provider]  nitroGLYCERIN (NITROSTAT) 0.4 MG SL tablet Place 1 tablet (0.4 mg total) under the tongue every 5 (five) minutes as needed for chest pain. 10/24/19  Yes Minus Breeding, MD   rosuvastatin (CRESTOR) 40 MG tablet Take 40 mg by mouth at bedtime. 06/17/19  Yes [provider]    Allergies    Patient has no known allergies.  Review of Systems   Review of Systems  Cardiovascular: Positive for chest pain and palpitations.  All other systems reviewed and are negative.   Physical Exam Updated Vital Signs BP 112/75   Pulse (!) 59   Temp 97.9 F (36.6 C) (Oral)   Resp 18   SpO2 97%   Physical Exam Vitals and nursing note reviewed.  Constitutional:      General: He is not in acute distress.    Appearance: Normal appearance. He is well-developed and well-nourished.  HENT:     Head: Normocephalic and atraumatic.     Right Ear: Hearing normal.     Left Ear: Hearing normal.     Nose: Nose normal.     Mouth/Throat:     Mouth: Oropharynx is clear and moist and mucous membranes are normal.  Eyes:     Extraocular Movements: EOM normal.     Conjunctiva/sclera: Conjunctivae normal.     Pupils: Pupils are equal, round, and reactive to light.  Cardiovascular:     Rate and Rhythm: Regular rhythm.     Heart sounds: S1 normal and S2 normal. No murmur heard. No friction rub. No gallop.   Pulmonary:     Effort: Pulmonary effort is normal. No respiratory distress.     Breath sounds: Normal breath sounds.  Chest:     Chest wall: No tenderness.  Abdominal:     General: Bowel sounds are normal.     Palpations: Abdomen is soft. There is no hepatosplenomegaly.     Tenderness: There is no abdominal tenderness. There is no guarding or rebound. Negative signs include Murphy's sign and McBurney's sign.     Hernia: No hernia is present.  Musculoskeletal:        General: Normal range of motion.     Cervical back: Normal range of motion and neck supple.  Skin:    General: Skin is warm, dry and intact.     Findings: No rash.     Nails: There is no cyanosis.  Neurological:     Mental Status: He is alert and oriented to person, place, and time.     GCS: GCS eye  subscore is 4. GCS verbal subscore is 5. GCS motor subscore is 6.     Cranial Nerves: No cranial nerve deficit.     Sensory: No sensory deficit.     Coordination: Coordination normal.     Deep Tendon Reflexes: Strength normal.  Psychiatric:        Mood and Affect: Mood and affect normal.        Speech: Speech normal.        Behavior: Behavior normal.        Thought Content: Thought content normal.     ED Results / Procedures / Treatments   Labs (all labs ordered  are listed, but only abnormal results are displayed) Labs Reviewed  BASIC METABOLIC PANEL - Abnormal; Notable for the following components:      Result Value   Glucose, Bld 104 (*)    Creatinine, Ser 1.37 (*)    GFR, Estimated 58 (*)    All other components within normal limits  RESP PANEL BY RT-PCR (FLU A&B, COVID) ARPGX2  CBC  CBC  CREATININE, SERUM  HIV ANTIBODY (ROUTINE TESTING W REFLEX)  D-DIMER, QUANTITATIVE (NOT AT [facility])  TROPONIN I (HIGH SENSITIVITY)  TROPONIN I (HIGH SENSITIVITY)  TROPONIN I (HIGH SENSITIVITY)    EKG EKG Interpretation  Date/Time:  Tuesday March 19 2020 22:28:49 EST Ventricular Rate:  84 PR Interval:  146 QRS Duration: 72 QT Interval:  344 QTC Calculation: 406 R Axis:   73 Text Interpretation: Normal sinus rhythm Possible Left atrial enlargement Borderline ECG Confirmed by Orpah Greek [phone]) on 03/19/2020 11:18:49 PM   Radiology DG Chest 2 View  Result Date: 03/19/2020 CLINICAL DATA:  65 year old male with chest pain. EXAM: CHEST - 2 VIEW COMPARISON:  Chest radiograph dated 08/14/2017. FINDINGS: No focal consolidation, pleural effusion or pneumothorax. The cardiac silhouette is within limits. No acute osseous pathology. IMPRESSION: No active cardiopulmonary disease. Electronically Signed   By: Anner Crete M.D.   On: 03/19/2020 22:44    Procedures Procedures (including critical care time)  Medications Ordered in ED Medications  aspirin chewable tablet 81 mg  (has no administration in time range)  metoprolol succinate (TOPROL-XL) 24 hr tablet 12.5 mg (has no administration in time range)  nitroGLYCERIN (NITROSTAT) SL tablet 0.4 mg (has no administration in time range)  rosuvastatin (CRESTOR) tablet 40 mg (has no administration in time range)  enoxaparin (LOVENOX) injection 40 mg (has no administration in time range)  acetaminophen (TYLENOL) tablet 650 mg (has no administration in time range)    Or  acetaminophen (TYLENOL) suppository 650 mg (has no administration in time range)  0.9 %  sodium chloride infusion (has no administration in time range)    ED Course  I have reviewed the triage vital signs and the nursing notes.  Pertinent labs & imaging results that were available during my care of the patient were reviewed by me and considered in my medical decision making (see chart for details).    MDM Rules/Calculators/A&P                          Patient presents to the emergency department with concerning chest pain.  Patient with history of previous stenting of LAD.  He has been experiencing fluttering in his chest and intermittent discomfort for the last week or so.  Today he has had 2 episodes of more significant chest discomfort.  Each time he took a nitroglycerin and the pain resolved.  Patient indicates that the symptoms he is currently experiencing today are very similar to the symptoms he had with his heart attack in 2019.  EKG does not show any obvious changes.  Troponins are negative.  Patient is felt to be very high risk for recurrent cardiac disease, will admit for further work-up.  Final Clinical Impression(s) / ED Diagnoses Final diagnoses:  Chest pain, unspecified type    Rx / DC Orders ED Discharge Orders    None       Nyela Cortinas, Gwenyth Allegra, MD 03/20/20 0710

## 2020-03-19 NOTE — ED Triage Notes (Signed)
Pt c/o chest discomfort that started today, pt took a NTG and pain was relieved. Pt reports pain came back a few hours later, took another NTG and pain improved again. Pt with hx MI and stent.

## 2020-03-20 ENCOUNTER — Encounter (HOSPITAL_COMMUNITY): Payer: Self-pay | Admitting: Internal Medicine

## 2020-03-20 ENCOUNTER — Ambulatory Visit (HOSPITAL_COMMUNITY)
Admission: EM | Disposition: A | Discharge status: Home / Self Care | Payer: Self-pay | Source: Home / Self Care | Attending: Emergency Medicine

## 2020-03-20 DIAGNOSIS — E785 Hyperlipidemia, unspecified: Secondary | ICD-10-CM

## 2020-03-20 DIAGNOSIS — I200 Unstable angina: Secondary | ICD-10-CM | POA: Diagnosis not present

## 2020-03-20 DIAGNOSIS — R072 Precordial pain: Secondary | ICD-10-CM

## 2020-03-20 DIAGNOSIS — I2510 Atherosclerotic heart disease of native coronary artery without angina pectoris: Secondary | ICD-10-CM | POA: Diagnosis not present

## 2020-03-20 HISTORY — PX: LEFT HEART CATH AND CORONARY ANGIOGRAPHY: CATH118249

## 2020-03-20 LAB — CREATININE, SERUM
Creatinine, Ser: 1 mg/dL (ref 0.61–1.24)
GFR, Estimated: >60 mL/min (ref >60)

## 2020-03-20 LAB — D-DIMER, QUANTITATIVE: D-Dimer, Quant: <0.27 ug/mL-FEU (ref 0.00–0.50)

## 2020-03-20 LAB — CBC
HCT: 45.5 % (ref 39.0–52.0)
Hemoglobin: 15 g/dL (ref 13.0–17.0)
MCH: 31.8 pg (ref 26.0–34.0)
MCHC: 33 g/dL (ref 30.0–36.0)
MCV: 96.6 fL (ref 80.0–100.0)
Platelets: 176 10*3/uL (ref 150–400)
RBC: 4.71 MIL/uL (ref 4.22–5.81)
RDW: 11.8 % (ref 11.5–15.5)
WBC: 5.5 10*3/uL (ref 4.0–10.5)
nRBC: 0 % (ref 0.0–0.2)

## 2020-03-20 LAB — HIV ANTIBODY (ROUTINE TESTING W REFLEX): HIV Screen 4th Generation wRfx: NONREACTIVE

## 2020-03-20 LAB — RESP PANEL BY RT-PCR (FLU A&B, COVID) ARPGX2
Influenza A by PCR: NEGATIVE
Influenza B by PCR: NEGATIVE
SARS Coronavirus 2 by RT PCR: NEGATIVE

## 2020-03-20 LAB — TROPONIN I (HIGH SENSITIVITY)
Troponin I (High Sensitivity): 4 ng/L (ref <18)
Troponin I (High Sensitivity): 4 ng/L (ref <18)

## 2020-03-20 SURGERY — LEFT HEART CATH AND CORONARY ANGIOGRAPHY
Anesthesia: LOCAL

## 2020-03-20 MED ORDER — AMLODIPINE BESYLATE 5 MG PO TABS: 5.0000 mg | ORAL_TABLET | Freq: Every day | ORAL | Status: DC

## 2020-03-20 MED ORDER — METOPROLOL SUCCINATE ER 25 MG PO TB24
12.5000 mg | ORAL_TABLET | Freq: Every day | ORAL | Status: DC
  Filled 2020-03-20: qty 1

## 2020-03-20 MED ORDER — SODIUM CHLORIDE 0.9% FLUSH: 3.0000 mL | INTRAVENOUS | Status: DC | PRN

## 2020-03-20 MED ORDER — ACETAMINOPHEN 325 MG PO TABS: 650.0000 mg | ORAL_TABLET | Freq: Four times a day (QID) | ORAL | Status: DC | PRN

## 2020-03-20 MED ORDER — MIDAZOLAM HCL 2 MG/2ML IJ SOLN
INTRAMUSCULAR | Status: AC
  Filled 2020-03-20: qty 2

## 2020-03-20 MED ORDER — METOPROLOL SUCCINATE ER 25 MG PO TB24: 12.5000 mg | ORAL_TABLET | Freq: Every day | ORAL | Status: DC

## 2020-03-20 MED ORDER — LIDOCAINE HCL (PF) 1 % IJ SOLN
INTRAMUSCULAR | Status: DC | PRN
  Administered 2020-03-20: 2 mL
  Administered 2020-03-20: 15 mL

## 2020-03-20 MED ORDER — IOHEXOL 350 MG/ML SOLN
INTRAVENOUS | Status: DC | PRN
  Administered 2020-03-20: 120 mL via INTRA_ARTERIAL

## 2020-03-20 MED ORDER — NITROGLYCERIN 1 MG/10 ML FOR IR/CATH LAB
INTRA_ARTERIAL | Status: DC | PRN
  Administered 2020-03-20: 200 ug via INTRACORONARY

## 2020-03-20 MED ORDER — ENOXAPARIN SODIUM 40 MG/0.4ML ~~LOC~~ SOLN: 40.0000 mg | SUBCUTANEOUS | Status: DC

## 2020-03-20 MED ORDER — ACETAMINOPHEN 650 MG RE SUPP: 650.0000 mg | Freq: Four times a day (QID) | RECTAL | Status: DC | PRN

## 2020-03-20 MED ORDER — MIDAZOLAM HCL 2 MG/2ML IJ SOLN
INTRAMUSCULAR | Status: DC | PRN
  Administered 2020-03-20 (×2): 1 mg via INTRAVENOUS

## 2020-03-20 MED ORDER — SODIUM CHLORIDE 0.9 % IV SOLN: INTRAVENOUS | Status: AC

## 2020-03-20 MED ORDER — HEPARIN SODIUM (PORCINE) 1000 UNIT/ML IJ SOLN
INTRAMUSCULAR | Status: AC
  Filled 2020-03-20: qty 1

## 2020-03-20 MED ORDER — SODIUM CHLORIDE 0.9% FLUSH
3.0000 mL | Freq: Two times a day (BID) | INTRAVENOUS | Status: DC
  Administered 2020-03-20 – 2020-03-21 (×2): 3 mL via INTRAVENOUS

## 2020-03-20 MED ORDER — HEPARIN (PORCINE) IN NACL 1000-0.9 UT/500ML-% IV SOLN
INTRAVENOUS | Status: AC
  Filled 2020-03-20: qty 500

## 2020-03-20 MED ORDER — NITROGLYCERIN 1 MG/10 ML FOR IR/CATH LAB
INTRA_ARTERIAL | Status: AC
  Filled 2020-03-20: qty 10

## 2020-03-20 MED ORDER — NITROGLYCERIN 0.4 MG SL SUBL: 0.4000 mg | SUBLINGUAL_TABLET | SUBLINGUAL | Status: DC | PRN

## 2020-03-20 MED ORDER — ACETAMINOPHEN 325 MG PO TABS: 650.0000 mg | ORAL_TABLET | ORAL | Status: DC | PRN

## 2020-03-20 MED ORDER — HEPARIN (PORCINE) IN NACL 1000-0.9 UT/500ML-% IV SOLN
INTRAVENOUS | Status: DC | PRN
  Administered 2020-03-20 (×2): 500 mL

## 2020-03-20 MED ORDER — LABETALOL HCL 5 MG/ML IV SOLN: 10.0000 mg | INTRAVENOUS | Status: AC | PRN

## 2020-03-20 MED ORDER — SODIUM CHLORIDE 0.9 % IV SOLN: 250.0000 mL | INTRAVENOUS | Status: DC | PRN

## 2020-03-20 MED ORDER — FENTANYL CITRATE (PF) 100 MCG/2ML IJ SOLN
INTRAMUSCULAR | Status: DC | PRN
  Administered 2020-03-20 (×2): 25 ug via INTRAVENOUS

## 2020-03-20 MED ORDER — HYDRALAZINE HCL 20 MG/ML IJ SOLN: 10.0000 mg | INTRAMUSCULAR | Status: AC | PRN

## 2020-03-20 MED ORDER — LIDOCAINE HCL (PF) 1 % IJ SOLN
INTRAMUSCULAR | Status: AC
  Filled 2020-03-20: qty 30

## 2020-03-20 MED ORDER — ROSUVASTATIN CALCIUM 20 MG PO TABS
40.0000 mg | ORAL_TABLET | Freq: Every day | ORAL | Status: DC
  Administered 2020-03-20: 40 mg via ORAL
  Filled 2020-03-20: qty 2

## 2020-03-20 MED ORDER — ASPIRIN 81 MG PO CHEW
81.0000 mg | CHEWABLE_TABLET | Freq: Every day | ORAL | Status: DC
  Administered 2020-03-20 – 2020-03-21 (×2): 81 mg via ORAL
  Filled 2020-03-20 (×2): qty 1

## 2020-03-20 MED ORDER — FENTANYL CITRATE (PF) 100 MCG/2ML IJ SOLN
INTRAMUSCULAR | Status: AC
  Filled 2020-03-20: qty 2

## 2020-03-20 MED ORDER — ONDANSETRON HCL 4 MG/2ML IJ SOLN: 4.0000 mg | Freq: Four times a day (QID) | INTRAMUSCULAR | Status: DC | PRN

## 2020-03-20 MED ORDER — VERAPAMIL HCL 2.5 MG/ML IV SOLN
INTRAVENOUS | Status: AC
  Filled 2020-03-20: qty 2

## 2020-03-20 MED ORDER — AMLODIPINE BESYLATE 2.5 MG PO TABS
2.5000 mg | ORAL_TABLET | Freq: Every day | ORAL | Status: DC
  Administered 2020-03-21 (×2): 2.5 mg via ORAL
  Filled 2020-03-20: qty 1

## 2020-03-20 SURGICAL SUPPLY — 17 items
CATH 5FR JL3.5 JR4 ANG PIG MP (CATHETERS) ×2 IMPLANT
CATH INFINITI 5 FR 3DRC (CATHETERS) ×2 IMPLANT
CATH INFINITI 5FR JL4 (CATHETERS) ×2 IMPLANT
CLOSURE MYNX CONTROL 5F (Vascular Products) ×2 IMPLANT
COVER DOME SNAP 22 D (MISCELLANEOUS) ×2 IMPLANT
GLIDESHEATH SLEND SS 6F .021 (SHEATH) ×2 IMPLANT
GUIDEWIRE INQWIRE 1.5J.035X260 (WIRE) ×1 IMPLANT
INQWIRE 1.5J .035X260CM (WIRE) ×2
KIT HEART LEFT (KITS) ×2 IMPLANT
PACK CARDIAC CATHETERIZATION (CUSTOM PROCEDURE TRAY) ×2 IMPLANT
SHEATH PINNACLE 5F 10CM (SHEATH) ×2 IMPLANT
SHEATH PROBE COVER 6X72 (BAG) ×2 IMPLANT
SYR CONTROL 10ML ANGIOGRAPHIC (SYRINGE) ×2 IMPLANT
SYR MEDRAD MARK 7 150ML (SYRINGE) ×2 IMPLANT
TRANSDUCER W/STOPCOCK (MISCELLANEOUS) ×2 IMPLANT
TUBING CIL FLEX 10 FLL-RA (TUBING) ×2 IMPLANT
WIRE EMERALD 3MM-J .035X150CM (WIRE) ×2 IMPLANT

## 2020-03-20 NOTE — Consult Note (Addendum)
Cardiology Consultation:   Patient ID: Casey Cummings MRN: [id number]; DOB: 04/13/1955  Admit date: 03/19/2020 Date of Consult: 03/20/2020  Primary Care Provider: Francesca Oman, DO [facility] Cardiologist: Minus Breeding, MD  [place] Electrophysiologist:  None    Patient Profile:   Casey Cummings is a 65 y.o. male with a hx of CAD, HLD, and mild diastolic dysfucntion who is being seen today for the evaluation of chest pain at the request of dr. Loleta Books.  History of Present Illness:   Casey Cummings was hospitalized 07/2017 with complaints of exertional chest pain. He had no prior cardiac history and underwent CT coronary which showed calcium score of zero, but stenosis in the LAD. During his hospitalization, he had another bout of substernal chest pain. EKG consistent with anterior STEMI. He was taken emergently to the calth lab and was treated with DES to LAD. No residual disease. Echocardiogram at that time with preserved EF and mild DD, no significant valvular disease. Cholesterol is at goal on 40 mg crestor (changed from lipitor). He las saw Dr. Percival Spanish in clinic on 07/17/19 and complained of some vague chest pain. He underwent POET which was nonischemic with adequate exercise. No further intervention was planned.   He presented to [facility] with exertional dyspnea and chest pain. He reports an episode of chest pain 3pm yesterday that resolved with SL nitro. He had a recurrence of chest pain at 8pm last evening. He took another SL nitro which did resolve his chest pain. Because he had to take 2 nitro in one day, he ultimately presented to the ER for evaluation.   Workup includes: HS troponin x 3 negative EKG does not appear ischemic AKI with sCr 1.37, previously normal - likely dehydration given long ER wait and NPO status CBC unremarkable COVID-19 negative D-dimer negative  During my interview, we reviewed his prior cath and POET. He describes chest pressure and burning sensation  in his substernal area that does not radiate and is without associated symptoms. CP started 3-4 week ago and generally occurs at rest and many times while he is working a stressful job. It does not wake him from sleep. He and his wife traveled to [place] in December and he was very active with grandchildren. They also walk frequently. He denies exertional chest pain. He does report DOE, but he is not concerned about this. CP at its worse was 3-4/10 yesterday. CP that has been occurring over the past month is generally rated as a 1/10. CP occurs approximately 3-4 times weekly and last about 30 min. It is not related to meals.   In review of his POET last summer, he does agree this is similar chest pressure, but became more intense yesterday prompting nitro x 2 and evaluation. He also reports increased awareness of chest pain and palpitations.    Past Medical History:  Diagnosis Date  . CAD (coronary artery disease)    a. 07/2017 s/p Ant STEMI/PCI: initially admitted w/ chest pain->Cardiac CTA: Ca2+ 0, >70%LAD stenosis->pt later dev Ant STEMI->Cath: LM nl, LAD 90p (3.5x16 Synergy DES), 71m, LCX nl, RCA nl.  . Diastolic dysfunction    a. 07/2017 Echo: EF 65-70%, mild LVH, Gr1 DD.  Marland Kitchen Dyslipidemia, goal LDL below 70   . Pulmonary nodules    a. 06/2017 Chest CT [facility]): biapical airspace dzs w/ more nodular focus in RUL w/ some associated bronchiectasis and prominent pleural-parenchymal scarring (seen by Pulm - similar findings on CT neck in 01/2016).  Past Surgical History:  Procedure Laterality Date  . CORONARY/GRAFT ACUTE MI REVASCULARIZATION N/A 07/08/2017   Procedure: CORONARY/GRAFT ACUTE MI REVASCULARIZATION;  Surgeon: Leonie Man, MD;  Location: [place] [facility];  Service: Cardiovascular;  Laterality: N/A;  . LEFT HEART CATH AND CORONARY ANGIOGRAPHY N/A 07/08/2017   Procedure: LEFT HEART CATH AND CORONARY ANGIOGRAPHY;  Surgeon: Leonie Man, MD;  Location: [place] [facility];  Service:  Cardiovascular;  Laterality: N/A;     Home Medications:  Prior to Admission medications   Medication Sig Start Date End Date Taking? Authorizing Provider  aspirin 81 MG chewable tablet Chew 1 tablet (81 mg total) by mouth daily. 07/10/17  Yes Kroeger, Lorelee Cover., PA-C  Ergocalciferol (VITAMIN D2 PO) Take 2,000 Units by mouth every other day.   Yes [provider]  metoprolol succinate (TOPROL-XL) 25 MG 24 hr tablet TAKE 1/2 TABLET BY MOUTH EVERY DAY Patient taking differently: Take 12.5 mg by mouth at bedtime. 08/29/19  Yes Minus Breeding, MD  Multiple Vitamin (MULTIVITAMIN) tablet Take 1 tablet by mouth daily.   Yes [provider]  nitroGLYCERIN (NITROSTAT) 0.4 MG SL tablet Place 1 tablet (0.4 mg total) under the tongue every 5 (five) minutes as needed for chest pain. 10/24/19  Yes Minus Breeding, MD  rosuvastatin (CRESTOR) 40 MG tablet Take 40 mg by mouth at bedtime. 06/17/19  Yes [provider]    Inpatient Medications: Scheduled Meds: . aspirin  81 mg Oral Daily  . enoxaparin (LOVENOX) injection  40 mg Subcutaneous Q24H  . metoprolol succinate  12.5 mg Oral QHS  . rosuvastatin  40 mg Oral QHS   Continuous Infusions: . sodium chloride     PRN Meds: acetaminophen **OR** acetaminophen, nitroGLYCERIN  Allergies:   No Known Allergies  Social History:   Social History   Socioeconomic History  . Marital status: Married    Spouse name: Not on file  . Number of children: Not on file  . Years of education: Not on file  . Highest education level: Not on file  Occupational History  . 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 Smoking status: Never Smoker  . Smokeless tobacco: Never Used  Substance and Sexual Activity  . Alcohol use: Yes    Comment: Occasionally.  . Drug use: Never  . Sexual activity: Not on file  Other Topics Concern  . Not on file  Social History Narrative   Lives locally with wife.    Social Determinants of Health   Financial Resource Strain: Not on file  Food Insecurity: Not on file  Transportation Needs: Not on file  Physical Activity: Not on file  Stress: Not on file  Social Connections: Not on file  Intimate Partner Violence: Not on file    Family History:    Family History  Problem Relation Age of Onset  . CAD Mother      ROS:  Please see the history of present illness.   All other ROS reviewed and negative.     Physical Exam/Data:   Vitals:   03/20/20 0330 03/20/20 0345 03/20/20 0400 03/20/20 0700  BP: 126/84 112/71 112/75 107/69  Pulse: 63 (!) 57 (!) 59 (!) 58  Resp: 12 14 18 16   Temp:      TempSrc:      SpO2: 98% 96% 97% 97%   No intake or output data in the 24 hours ending 03/20/20 1217 Last 3 Weights 07/17/2019 02/07/2019  07/25/2018  Weight (lbs) 168 lb 166 lb 6.4 oz 160 lb  Weight (kg) 76.204 kg 75.479 kg 72.576 kg     There is no height or weight on file to calculate BMI.  General:  Well nourished, well developed, in no acute distress HEENT: normal Lymph: no adenopathy Neck: no JVD Endocrine:  No thryomegaly Vascular: No carotid bruits; FA pulses 2+ bilaterally without bruits  Cardiac:  normal S1, S2; RRR; no murmur  Lungs:  clear to auscultation bilaterally, no wheezing, rhonchi or rales  Abd: soft, nontender, no hepatomegaly  Ext: no edema Musculoskeletal:  No deformities, BUE and BLE strength normal and equal Skin: warm and dry  Neuro:  CNs 2-12 intact, no focal abnormalities noted Psych:  Normal affect   EKG:  The EKG was personally reviewed and demonstrates:  Sinus rhythm HR 84, possible left atrial enlargement Telemetry:  Telemetry was personally reviewed and demonstrates:  Sinus with HR 60s  Relevant CV Studies:  ETT 08/15/19:  No ischemia. Normal BP response to exercise. Normal exercise tolerance.     Left heart cath 07/08/17:  Culprit lesion: Prox LAD lesion is 90% stenosed.  A drug-eluting stent was  successfully placed using a STENT SYNERGY DES 3.5X16. Postdilated in tapered fashion to 4.1-3.9 mm  Post intervention, there is a 0% residual stenosis.  Mid LAD to Dist LAD lesion is 40% stenosed. The downstream LAD is small caliber diffusely diseased.  The left ventricular systolic function is normal. The left ventricular ejection fraction is 55-65% by visual estimate.  LV end diastolic pressure is normal.   Severe single-vessel disease status post PCI with DES stent.  He will be transferred to the ICU overnight based on the acute nature of his presentation.   Echo 07/08/17: Study Conclusions   - Left ventricle: The cavity size was normal. Wall thickness was  increased in a pattern of mild LVH. Systolic function was  vigorous. The estimated ejection fraction was in the range of 65%  to 70%. Wall motion was normal; there were no regional wall  motion abnormalities. Doppler parameters are consistent with  abnormal left ventricular relaxation (grade 1 diastolic  dysfunction). The E/e&' ratio is between 8-15, suggesting  indeterminate LV filling pressure.  - Left atrium: The atrium was normal in size.  - Inferior vena cava: The vessel was normal in size. The  respirophasic diameter changes were in the normal range (>= 50%),  consistent with normal central venous pressure.   Impressions:   - LVEF 65-70%, mild LVH, normal wall motion, grade 1 DD,  indeterminate LV filling pressure, normal LA size, normal IVC.   Laboratory Data:  High Sensitivity Troponin:   Recent Labs  Lab 03/19/20 2239 03/20/20 0035 03/20/20 0451  TROPONINIHS 3 4 4      Chemistry Recent Labs  Lab 03/19/20 2239 03/20/20 0451  NA 138  --   K 3.7  --   CL 104  --   CO2 24  --   GLUCOSE 104*  --   BUN 14  --   CREATININE 1.37* 1.00  CALCIUM 9.4  --   GFRNONAA 58* >60  ANIONGAP 10  --     No results for input(s): PROT, ALBUMIN, AST, ALT, ALKPHOS, BILITOT in the last 168  hours. Hematology Recent Labs  Lab 03/19/20 2239 03/20/20 0451  WBC 5.7 5.5  RBC 5.09 4.71  HGB 16.1 15.0  HCT 48.3 45.5  MCV 94.9 96.6  MCH 31.6 31.8  MCHC 33.3 33.0  RDW  11.6 11.8  PLT 204 176   BNPNo results for input(s): BNP, PROBNP in the last 168 hours.  DDimer  Recent Labs  Lab 03/20/20 0726  DDIMER <0.27     Radiology/Studies:  DG Chest 2 View  Result Date: 03/19/2020 CLINICAL DATA:  65 year old male with chest pain. EXAM: CHEST - 2 VIEW COMPARISON:  Chest radiograph dated 08/14/2017. FINDINGS: No focal consolidation, pleural effusion or pneumothorax. The cardiac silhouette is within limits. No acute osseous pathology. IMPRESSION: No active cardiopulmonary disease. Electronically Signed   By: Anner Crete M.D.   On: 03/19/2020 22:44     Assessment and Plan:   Chest pain - hs troponin x 3 negative - EKG does not appear ischemic - D-dimer negative - he describes somewhat atypical pain as its non-exertional and described as a burning sensation - given his history, he is concerned about the status of his stent - we discussed possible options, including discharge with close follow up, stress testing tomorrow, or repeat heart catheterization today - alternative etiologies may include esophageal spasm vs microvascular disease vs noncardiac causes - he and his wife agree with watchful waiting, they are reassured by his CE and EKG   CAD NSTEMI s/p DES to LAD 07/2017 - continue ASA and BB   Chronic diastolic dysfunction - echo with normal EF and grade 1 DD - appears euvolemic and does not require diuretics   Hyperlipidemia with LDL goal < 70 - switched from lipitor to crestor 08/15/2019: Cholesterol, Total 113; HDL 53; LDL Chol Calc (NIH) 44; Triglycerides 81 - LDL at goal, continue present statin   Will discuss next steps with Dr. Percival Spanish.      HEAR Score (for undifferentiated chest pain):  HEAR Score: 4        For questions or updates, please  contact [facility] Please consult [email or web address] for contact info under    Signed, Ledora Bottcher, PA  03/20/2020 12:17 PM   History and all data above reviewed.  Patient examined.  I agree with the findings as above.  Patient presents with chest discomfort.  He has had some increase in dyspnea with exertion for weeks or months.  He had some fleeting chest discomfort probably in mid December.  However, over the last couple of days he has had chest discomfort reminiscent of his previous heart attack.  This has been at rest.  He cannot take nitroglycerin.  The symptoms were relieved both times with his nitroglycerin.  It has been 4-10 in intensity and a burning discomfort.  There is been no radiation to the jaw or into his arms.  He had no new PND or orthopnea but has had some decreased exercise tolerance.  The patient exam reveals COR:   RRR, no rubs, no murmur  ,  Lungs: Clear  ,  Abd: Positive bowel sounds, no rebound no guarding, Ext No edema  .  All available labs, radiology testing, previous records reviewed. Agree with documented assessment and plan.   Chest pain:  This is consistent with unstable angina given his past history and the description of his symptoms.  It is new onset resting discomfort.  I think the pretest probability of obstructive coronary disease is high.  Cardiac cath is indicated.  The patient understands that risks included but are not limited to stroke (1 in 1000), death (1 in 6), kidney failure [usually temporary] (1 in 500), bleeding (1 in 200), allergic reaction [possibly serious] (1 in 200).  The patient understands and agrees  to proceed.     Jeneen Rinks Maricela Schreur  3:12 PM  03/20/2020

## 2020-03-20 NOTE — H&P (Signed)
History and Physical    Casey Cummings [id number] DOB: [birth year] DOA: 03/19/2020  PCP: Francesca Oman, DO  Patient coming from: Home.  Chief Complaint: Chest pain.  HPI: Casey Cummings is a 65 y.o. male with history of CAD status post stenting in 2019 presents to the ER with complaint of chest pain.  Patient states he was working on his computer at around 3 PM yesterday he started having substernal chest pressure nonradiating present even at rest.  Patient took some sublingual nitroglycerin following which after few minutes patient started to subside and resolved.  Later in the evening around 8 PM patient's chest pain came back again and it was more intense.  And this time he again took sublingual nitroglycerin and got better but completely did not resolve and decided to come to the ER.  Patient states over the last 6 months he has been having exertional dyspnea.  Did not notice any swelling in the legs.  ED Course: In the ER patient was chest pain-free chest x-ray EKG cardiac markers were negative COVID test was negative.  Patient was not hypoxic.  Patient admitted for further management of chest pain with previous history of CAD status post stenting.  Review of Systems: As per HPI, rest all negative.   Past Medical History:  Diagnosis Date  . CAD (coronary artery disease)    a. 07/2017 s/p Ant STEMI/PCI: initially admitted w/ chest pain->Cardiac CTA: Ca2+ 0, >70%LAD stenosis->pt later dev Ant STEMI->Cath: LM nl, LAD 90p (3.5x16 Synergy DES), 43m, LCX nl, RCA nl.  . Diastolic dysfunction    a. 07/2017 Echo: EF 65-70%, mild LVH, Gr1 DD.  Marland Kitchen Dyslipidemia, goal LDL below 70   . Pulmonary nodules    a. 06/2017 Chest CT [facility]): biapical airspace dzs w/ more nodular focus in RUL w/ some associated bronchiectasis and prominent pleural-parenchymal scarring (seen by Pulm - similar findings on CT neck in 01/2016).    Past Surgical History:  Procedure Laterality Date  . CORONARY/GRAFT ACUTE  MI REVASCULARIZATION N/A 07/08/2017   Procedure: CORONARY/GRAFT ACUTE MI REVASCULARIZATION;  Surgeon: Leonie Man, MD;  Location: [place] [facility];  Service: Cardiovascular;  Laterality: N/A;  . LEFT HEART CATH AND CORONARY ANGIOGRAPHY N/A 07/08/2017   Procedure: LEFT HEART CATH AND CORONARY ANGIOGRAPHY;  Surgeon: Leonie Man, MD;  Location: [place] [facility];  Service: Cardiovascular;  Laterality: N/A;     reports that he has never smoked. He has never used smokeless tobacco. He reports current alcohol use. He reports that he does not use drugs.  No Known Allergies  Family History  Problem Relation Age of Onset  . CAD Mother     Prior to Admission medications   Medication Sig Start Date End Date Taking? Authorizing Provider  aspirin 81 MG chewable tablet Chew 1 tablet (81 mg total) by mouth daily. 07/10/17  Yes Kroeger, Lorelee Cover., PA-C  Ergocalciferol (VITAMIN D2 PO) Take 2,000 Units by mouth every other day.   Yes [provider]  metoprolol succinate (TOPROL-XL) 25 MG 24 hr tablet TAKE 1/2 TABLET BY MOUTH EVERY DAY Patient taking differently: Take 12.5 mg by mouth at bedtime. 08/29/19  Yes Minus Breeding, MD  Multiple Vitamin (MULTIVITAMIN) tablet Take 1 tablet by mouth daily.   Yes [provider]  nitroGLYCERIN (NITROSTAT) 0.4 MG SL tablet Place 1 tablet (0.4 mg total) under the tongue every 5 (five) minutes as needed for chest pain. 10/24/19  Yes Minus Breeding, MD  rosuvastatin (Roberts)  40 MG tablet Take 40 mg by mouth at bedtime. 06/17/19  Yes [provider]    Physical Exam: Constitutional: Moderately built and nourished. Vitals:   03/20/20 0300 03/20/20 0315 03/20/20 0330 03/20/20 0345  BP: 95/70 111/73 126/84 112/71  Pulse: 63 (!) 58 63 (!) 57  Resp: 14 13 12 14   Temp:      TempSrc:      SpO2: 96% 95% 98% 96%   Eyes: Anicteric no pallor. ENMT: No discharge from the ears eyes nose or mouth. Neck: No mass felt.  No neck  rigidity. Respiratory: No rhonchi or crepitations. Cardiovascular: S1-S2 heard. Abdomen: Soft nontender bowel sounds present. Musculoskeletal: No edema. Skin: No rash. Neurologic: Alert awake oriented to time place and person.  Moves all extremities. Psychiatric: Appears normal.  Normal affect.   Labs on Admission: I have personally reviewed following labs and imaging studies  CBC: Recent Labs  Lab 03/19/20 2239  WBC 5.7  HGB 16.1  HCT 48.3  MCV 94.9  PLT 0000000   Basic Metabolic Panel: Recent Labs  Lab 03/19/20 2239  NA 138  K 3.7  CL 104  CO2 24  GLUCOSE 104*  BUN 14  CREATININE 1.37*  CALCIUM 9.4   GFR: CrCl cannot be calculated (Unknown ideal weight.). Liver Function Tests: No results for input(s): AST, ALT, ALKPHOS, BILITOT, PROT, ALBUMIN in the last 168 hours. No results for input(s): LIPASE, AMYLASE in the last 168 hours. No results for input(s): AMMONIA in the last 168 hours. Coagulation Profile: No results for input(s): INR, PROTIME in the last 168 hours. Cardiac Enzymes: No results for input(s): CKTOTAL, CKMB, CKMBINDEX, TROPONINI in the last 168 hours. BNP (last 3 results) No results for input(s): PROBNP in the last 8760 hours. HbA1C: No results for input(s): HGBA1C in the last 72 hours. CBG: No results for input(s): GLUCAP in the last 168 hours. Lipid Profile: No results for input(s): CHOL, HDL, LDLCALC, TRIG, CHOLHDL, LDLDIRECT in the last 72 hours. Thyroid Function Tests: No results for input(s): TSH, T4TOTAL, FREET4, T3FREE, THYROIDAB in the last 72 hours. Anemia Panel: No results for input(s): VITAMINB12, FOLATE, FERRITIN, TIBC, IRON, RETICCTPCT in the last 72 hours. Urine analysis: No results found for: COLORURINE, APPEARANCEUR, LABSPEC, PHURINE, GLUCOSEU, HGBUR, BILIRUBINUR, KETONESUR, PROTEINUR, UROBILINOGEN, NITRITE, LEUKOCYTESUR Sepsis Labs: @LABRCNTIP (procalcitonin:4,lacticidven:4) ) Recent Results (from the past 240 hour(s))  Resp  Panel by RT-PCR (Flu A&B, Covid) Nasopharyngeal Swab     Status: None   Collection Time: 03/20/20 12:38 AM   Specimen: Nasopharyngeal Swab; Nasopharyngeal(NP) swabs in vial transport medium  Result Value Ref Range Status   SARS Coronavirus 2 by RT PCR NEGATIVE NEGATIVE Final    Comment: (NOTE) SARS-CoV-2 target nucleic acids are NOT DETECTED.  The SARS-CoV-2 RNA is generally detectable in upper respiratory specimens during the acute phase of infection. The lowest concentration of SARS-CoV-2 viral copies this assay can detect is 138 copies/mL. A negative result does not preclude SARS-Cov-2 infection and should not be used as the sole basis for treatment or other patient management decisions. A negative result may occur with  improper specimen collection/handling, submission of specimen other than nasopharyngeal swab, presence of viral mutation(s) within the areas targeted by this assay, and inadequate number of viral copies(<138 copies/mL). A negative result must be combined with clinical observations, patient history, and epidemiological information. The expected result is Negative.  Fact Sheet for Patients:  EntrepreneurPulse.com.au  Fact Sheet for Healthcare Providers:  IncredibleEmployment.be  This test is no t yet approved  or cleared by the [place] and  has been authorized for detection and/or diagnosis of SARS-CoV-2 by FDA under an Emergency Use Authorization (EUA). This EUA will remain  in effect (meaning this test can be used) for the duration of the COVID-19 declaration under Section 564(b)(1) of the Act, 21 U.S.C.section 360bbb-3(b)(1), unless the authorization is terminated  or revoked sooner.       Influenza A by PCR NEGATIVE NEGATIVE Final   Influenza B by PCR NEGATIVE NEGATIVE Final    Comment: (NOTE) The Xpert Xpress SARS-CoV-2/FLU/RSV plus assay is intended as an aid in the diagnosis of influenza from Nasopharyngeal  swab specimens and should not be used as a sole basis for treatment. Nasal washings and aspirates are unacceptable for Xpert Xpress SARS-CoV-2/FLU/RSV testing.  Fact Sheet for Patients: EntrepreneurPulse.com.au  Fact Sheet for Healthcare Providers: IncredibleEmployment.be  This test is not yet approved or cleared by the [place] FDA and has been authorized for detection and/or diagnosis of SARS-CoV-2 by FDA under an Emergency Use Authorization (EUA). This EUA will remain in effect (meaning this test can be used) for the duration of the COVID-19 declaration under Section 564(b)(1) of the Act, 21 U.S.C. section 360bbb-3(b)(1), unless the authorization is terminated or revoked.  Performed at [facility], [place] [address], [place], [place] [address]      Radiological Exams on Admission: DG Chest 2 View  Result Date: 03/19/2020 CLINICAL DATA:  65 year old male with chest pain. EXAM: CHEST - 2 VIEW COMPARISON:  Chest radiograph dated 08/14/2017. FINDINGS: No focal consolidation, pleural effusion or pneumothorax. The cardiac silhouette is within limits. No acute osseous pathology. IMPRESSION: No active cardiopulmonary disease. Electronically Signed   By: Anner Crete M.D.   On: 03/19/2020 22:44    EKG: Independently reviewed.  Normal sinus rhythm.  Assessment/Plan Principal Problem:   Chest pain    1. Chest pain with history of CAD status post stenting concerning for angina presently chest pain-free.  Will cycle cardiac markers.  Continue aspirin statins and beta-blockers.  Consult cardiology.  Check D-dimer. 2. Acute renal failure -creatinine in 2019 was around 1.04 now is 1.34 with GFR of 58%.  We will continue to monitor gently hydrate. 3. History of diastolic dysfunction per 2D echo in 2019.  Appears compensated.   DVT prophylaxis: Lovenox. Code Status: Full code. Family Communication: Discussed with patient. Disposition Plan:  Home. Consults called: Cardiology. Admission status: Observation.   Rise Patience MD [facility] Pager [phone].  If 7PM-7AM, please contact night-coverage [email or web address] Password [facility]  03/20/2020, 4:05 AM

## 2020-03-20 NOTE — Interval H&P Note (Signed)
History and Physical Interval Note:  03/20/2020 4:06 PM  Casey Cummings  has presented today for surgery, with the diagnosis of NSTEMI.  The various methods of treatment have been discussed with the patient and family. After consideration of risks, benefits and other options for treatment, the patient has consented to  Procedure(s): LEFT HEART CATH AND CORONARY ANGIOGRAPHY (N/A) as a surgical intervention.  The patient's history has been reviewed, patient examined, no change in status, stable for surgery.  I have reviewed the patient's chart and labs.  Questions were answered to the patient's satisfaction.    Cath Lab Visit (complete for each Cath Lab visit)  Clinical Evaluation Leading to the Procedure:   ACS: No.  Non-ACS:    Anginal Classification: CCS III  Anti-ischemic medical therapy: Maximal Therapy (2 or more classes of medications)  Non-Invasive Test Results: No non-invasive testing performed  Prior CABG: No previous CABG         Casey Cummings

## 2020-03-20 NOTE — Progress Notes (Signed)
  PROGRESS NOTE    Falcon Mccaskey  HEN:[id number] DOB: [birth year] DOA: 03/19/2020 PCP: Francesca Oman, DO      Brief Narrative:  Mr. Koontz is a 65 y.o. M with CAD s/p PCI 2019 who presented with chest pain.  This was progressing over a few days, present mostly at rest, but reminiscent of his prior angina.  EKG normal, troponins within normal limits. Placed on observation for Cardiology risk stratification.       Assessment & Plan:  Chest pain EKG and troponins reassuring.  Evaluated by Cardiology who recommended LHC to eval for angiographic progression of disease.  During cath, no significant disease but spasm noted.    -Start amlodipine if tolerated -Observe on new amlodipine overnight  -Continue metoprolol and statin            MDM: This is a no charge note.  For further details, please see H&P by my partner Dr. Hal Hope from earlier today.  The below labs and imaging reports were reviewed and summarized above.           Objective: Examination: The patient was seen and examined.       Edwin Dada, MD [facility] 03/20/2020, 5:27 PM     Please page though [place] or Epic secure chat:  For password, contact charge nurse

## 2020-03-20 NOTE — H&P (View-Only) (Signed)
Cardiology Consultation:   Patient ID: Casey Cummings MRN: [id number]; DOB: 1956-01-16  Admit date: 03/19/2020 Date of Consult: 03/20/2020  Primary Care Provider: Francesca Oman, DO [facility] Cardiologist: Minus Breeding, MD  [place] Electrophysiologist:  None    Patient Profile:   Casey Cummings is a 65 y.o. male with a hx of CAD, HLD, and mild diastolic dysfucntion who is being seen today for the evaluation of chest pain at the request of dr. Loleta Books.  History of Present Illness:   [name] was hospitalized 07/2017 with complaints of exertional chest pain. He had no prior cardiac history and underwent CT coronary which showed calcium score of zero, but stenosis in the LAD. During his hospitalization, he had another bout of substernal chest pain. EKG consistent with anterior STEMI. He was taken emergently to the calth lab and was treated with DES to LAD. No residual disease. Echocardiogram at that time with preserved EF and mild DD, no significant valvular disease. Cholesterol is at goal on 40 mg crestor (changed from lipitor). He las saw Dr. Percival Spanish in clinic on 07/17/19 and complained of some vague chest pain. He underwent POET which was nonischemic with adequate exercise. No further intervention was planned.   He presented to [facility] with exertional dyspnea and chest pain. He reports an episode of chest pain 3pm yesterday that resolved with SL nitro. He had a recurrence of chest pain at 8pm last evening. He took another SL nitro which did resolve his chest pain. Because he had to take 2 nitro in one day, he ultimately presented to the ER for evaluation.   Workup includes: HS troponin x 3 negative EKG does not appear ischemic AKI with sCr 1.37, previously normal - likely dehydration given long ER wait and NPO status CBC unremarkable COVID-19 negative D-dimer negative  During my interview, we reviewed his prior cath and POET. He describes chest pressure and burning sensation  in his substernal area that does not radiate and is without associated symptoms. CP started 3-4 week ago and generally occurs at rest and many times while he is working a stressful job. It does not wake him from sleep. He and his wife traveled to [place] in December and he was very active with grandchildren. They also walk frequently. He denies exertional chest pain. He does report DOE, but he is not concerned about this. CP at its worse was 3-4/10 yesterday. CP that has been occurring over the past month is generally rated as a 1/10. CP occurs approximately 3-4 times weekly and last about 30 min. It is not related to meals.   In review of his POET last summer, he does agree this is similar chest pressure, but became more intense yesterday prompting nitro x 2 and evaluation. He also reports increased awareness of chest pain and palpitations.    Past Medical History:  Diagnosis Date  . CAD (coronary artery disease)    a. 07/2017 s/p Ant STEMI/PCI: initially admitted w/ chest pain->Cardiac CTA: Ca2+ 0, >70%LAD stenosis->pt later dev Ant STEMI->Cath: LM nl, LAD 90p (3.5x16 Synergy DES), 38m, LCX nl, RCA nl.  . Diastolic dysfunction    a. 07/2017 Echo: EF 65-70%, mild LVH, Gr1 DD.  Marland Kitchen Dyslipidemia, goal LDL below 70   . Pulmonary nodules    a. 06/2017 Chest [facility]): biapical airspace dzs w/ more nodular focus in RUL w/ some associated bronchiectasis and prominent pleural-parenchymal scarring (seen by Pulm - similar findings on CT neck in 01/2016).  Past Surgical History:  Procedure Laterality Date  . CORONARY/GRAFT ACUTE MI REVASCULARIZATION N/A 07/08/2017   Procedure: CORONARY/GRAFT ACUTE MI REVASCULARIZATION;  Surgeon: Leonie Man, MD;  Location: [place] [facility];  Service: Cardiovascular;  Laterality: N/A;  . LEFT HEART CATH AND CORONARY ANGIOGRAPHY N/A 07/08/2017   Procedure: LEFT HEART CATH AND CORONARY ANGIOGRAPHY;  Surgeon: Leonie Man, MD;  Location: [place] [facility];  Service:  Cardiovascular;  Laterality: N/A;     Home Medications:  Prior to Admission medications   Medication Sig Start Date End Date Taking? Authorizing Provider  aspirin 81 MG chewable tablet Chew 1 tablet (81 mg total) by mouth daily. 07/10/17  Yes Kroeger, Lorelee Cover., PA-C  Ergocalciferol (VITAMIN D2 PO) Take 2,000 Units by mouth every other day.   Yes [provider]  metoprolol succinate (TOPROL-XL) 25 MG 24 hr tablet TAKE 1/2 TABLET BY MOUTH EVERY DAY Patient taking differently: Take 12.5 mg by mouth at bedtime. 08/29/19  Yes Minus Breeding, MD  Multiple Vitamin (MULTIVITAMIN) tablet Take 1 tablet by mouth daily.   Yes [provider]  nitroGLYCERIN (NITROSTAT) 0.4 MG SL tablet Place 1 tablet (0.4 mg total) under the tongue every 5 (five) minutes as needed for chest pain. 10/24/19  Yes Minus Breeding, MD  rosuvastatin (CRESTOR) 40 MG tablet Take 40 mg by mouth at bedtime. 06/17/19  Yes [provider]    Inpatient Medications: Scheduled Meds: . aspirin  81 mg Oral Daily  . enoxaparin (LOVENOX) injection  40 mg Subcutaneous Q24H  . metoprolol succinate  12.5 mg Oral QHS  . rosuvastatin  40 mg Oral QHS   Continuous Infusions: . sodium chloride     PRN Meds: acetaminophen **OR** acetaminophen, nitroGLYCERIN  Allergies:   No Known Allergies  Social History:   Social History   Socioeconomic History  . Marital status: Married    Spouse name: Not on file  . Number of children: Not on file  . Years of education: Not on file  . Highest education level: Not on file  Occupational History  . 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 Smoking status: Never Smoker  . Smokeless tobacco: Never Used  Substance and Sexual Activity  . Alcohol use: Yes    Comment: Occasionally.  . Drug use: Never  . Sexual activity: Not on file  Other Topics Concern  . Not on file  Social History Narrative   Lives locally with wife.    Social Determinants of Health   Financial Resource Strain: Not on file  Food Insecurity: Not on file  Transportation Needs: Not on file  Physical Activity: Not on file  Stress: Not on file  Social Connections: Not on file  Intimate Partner Violence: Not on file    Family History:    Family History  Problem Relation Age of Onset  . CAD Mother      ROS:  Please see the history of present illness.   All other ROS reviewed and negative.     Physical Exam/Data:   Vitals:   03/20/20 0330 03/20/20 0345 03/20/20 0400 03/20/20 0700  BP: 126/84 112/71 112/75 107/69  Pulse: 63 (!) 57 (!) 59 (!) 58  Resp: 12 14 18 16   Temp:      TempSrc:      SpO2: 98% 96% 97% 97%   No intake or output data in the 24 hours ending 03/20/20 1217 Last 3 Weights 07/17/2019 02/07/2019  07/25/2018  Weight (lbs) 168 lb 166 lb 6.4 oz 160 lb  Weight (kg) 76.204 kg 75.479 kg 72.576 kg     There is no height or weight on file to calculate BMI.  General:  Well nourished, well developed, in no acute distress HEENT: normal Lymph: no adenopathy Neck: no JVD Endocrine:  No thryomegaly Vascular: No carotid bruits; FA pulses 2+ bilaterally without bruits  Cardiac:  normal S1, S2; RRR; no murmur  Lungs:  clear to auscultation bilaterally, no wheezing, rhonchi or rales  Abd: soft, nontender, no hepatomegaly  Ext: no edema Musculoskeletal:  No deformities, BUE and BLE strength normal and equal Skin: warm and dry  Neuro:  CNs 2-12 intact, no focal abnormalities noted Psych:  Normal affect   EKG:  The EKG was personally reviewed and demonstrates:  Sinus rhythm HR 84, possible left atrial enlargement Telemetry:  Telemetry was personally reviewed and demonstrates:  Sinus with HR 60s  Relevant CV Studies:  ETT 08/15/19:  No ischemia. Normal BP response to exercise. Normal exercise tolerance.     Left heart cath 07/08/17:  Culprit lesion: Prox LAD lesion is 90% stenosed.  A drug-eluting stent was  successfully placed using a STENT SYNERGY DES 3.5X16. Postdilated in tapered fashion to 4.1-3.9 mm  Post intervention, there is a 0% residual stenosis.  Mid LAD to Dist LAD lesion is 40% stenosed. The downstream LAD is small caliber diffusely diseased.  The left ventricular systolic function is normal. The left ventricular ejection fraction is 55-65% by visual estimate.  LV end diastolic pressure is normal.   Severe single-vessel disease status post PCI with DES stent.  He will be transferred to the ICU overnight based on the acute nature of his presentation.   Echo 07/08/17: Study Conclusions   - Left ventricle: The cavity size was normal. Wall thickness was  increased in a pattern of mild LVH. Systolic function was  vigorous. The estimated ejection fraction was in the range of 65%  to 70%. Wall motion was normal; there were no regional wall  motion abnormalities. Doppler parameters are consistent with  abnormal left ventricular relaxation (grade 1 diastolic  dysfunction). The E/e&' ratio is between 8-15, suggesting  indeterminate LV filling pressure.  - Left atrium: The atrium was normal in size.  - Inferior vena cava: The vessel was normal in size. The  respirophasic diameter changes were in the normal range (>= 50%),  consistent with normal central venous pressure.   Impressions:   - LVEF 65-70%, mild LVH, normal wall motion, grade 1 DD,  indeterminate LV filling pressure, normal LA size, normal IVC.   Laboratory Data:  High Sensitivity Troponin:   Recent Labs  Lab 03/19/20 2239 03/20/20 0035 03/20/20 0451  TROPONINIHS 3 4 4      Chemistry Recent Labs  Lab 03/19/20 2239 03/20/20 0451  NA 138  --   K 3.7  --   CL 104  --   CO2 24  --   GLUCOSE 104*  --   BUN 14  --   CREATININE 1.37* 1.00  CALCIUM 9.4  --   GFRNONAA 58* >60  ANIONGAP 10  --     No results for input(s): PROT, ALBUMIN, AST, ALT, ALKPHOS, BILITOT in the last 168  hours. Hematology Recent Labs  Lab 03/19/20 2239 03/20/20 0451  WBC 5.7 5.5  RBC 5.09 4.71  HGB 16.1 15.0  HCT 48.3 45.5  MCV 94.9 96.6  MCH 31.6 31.8  MCHC 33.3 33.0  RDW  11.6 11.8  PLT 204 176   BNPNo results for input(s): BNP, PROBNP in the last 168 hours.  DDimer  Recent Labs  Lab 03/20/20 0726  DDIMER <0.27     Radiology/Studies:  DG Chest 2 View  Result Date: 03/19/2020 CLINICAL DATA:  65 year old male with chest pain. EXAM: CHEST - 2 VIEW COMPARISON:  Chest radiograph dated 08/14/2017. FINDINGS: No focal consolidation, pleural effusion or pneumothorax. The cardiac silhouette is within limits. No acute osseous pathology. IMPRESSION: No active cardiopulmonary disease. Electronically Signed   By: Anner Crete M.D.   On: 03/19/2020 22:44     Assessment and Plan:   Chest pain - hs troponin x 3 negative - EKG does not appear ischemic - D-dimer negative - he describes somewhat atypical pain as its non-exertional and described as a burning sensation - given his history, he is concerned about the status of his stent - we discussed possible options, including discharge with close follow up, stress testing tomorrow, or repeat heart catheterization today - alternative etiologies may include esophageal spasm vs microvascular disease vs noncardiac causes - he and his wife agree with watchful waiting, they are reassured by his CE and EKG   CAD NSTEMI s/p DES to LAD 07/2017 - continue ASA and BB   Chronic diastolic dysfunction - echo with normal EF and grade 1 DD - appears euvolemic and does not require diuretics   Hyperlipidemia with LDL goal < 70 - switched from lipitor to crestor 08/15/2019: Cholesterol, Total 113; HDL 53; LDL Chol Calc (NIH) 44; Triglycerides 81 - LDL at goal, continue present statin   Will discuss next steps with Dr. Percival Spanish.      HEAR Score (for undifferentiated chest pain):  HEAR Score: 4        For questions or updates, please  contact [place] Please consult [email or web address] for contact info under    Signed, Ledora Bottcher, PA  03/20/2020 12:17 PM   History and all data above reviewed.  Patient examined.  I agree with the findings as above.  Patient presents with chest discomfort.  He has had some increase in dyspnea with exertion for weeks or months.  He had some fleeting chest discomfort probably in mid December.  However, over the last couple of days he has had chest discomfort reminiscent of his previous heart attack.  This has been at rest.  He cannot take nitroglycerin.  The symptoms were relieved both times with his nitroglycerin.  It has been 4-10 in intensity and a burning discomfort.  There is been no radiation to the jaw or into his arms.  He had no new PND or orthopnea but has had some decreased exercise tolerance.  The patient exam reveals COR:   RRR, no rubs, no murmur  ,  Lungs: Clear  ,  Abd: Positive bowel sounds, no rebound no guarding, Ext No edema  .  All available labs, radiology testing, previous records reviewed. Agree with documented assessment and plan.   Chest pain:  This is consistent with unstable angina given his past history and the description of his symptoms.  It is new onset resting discomfort.  I think the pretest probability of obstructive coronary disease is high.  Cardiac cath is indicated.  The patient understands that risks included but are not limited to stroke (1 in 1000), death (1 in 73), kidney failure [usually temporary] (1 in 500), bleeding (1 in 200), allergic reaction [possibly serious] (1 in 200).  The patient understands and agrees  to proceed.     Jeneen Rinks Adelyn Roscher  3:12 PM  03/20/2020

## 2020-03-21 ENCOUNTER — Ambulatory Visit: Payer: Managed Care, Other (non HMO) | Admitting: Physician Assistant

## 2020-03-21 ENCOUNTER — Encounter (HOSPITAL_COMMUNITY): Payer: Self-pay | Admitting: Cardiovascular Disease

## 2020-03-21 DIAGNOSIS — R072 Precordial pain: Secondary | ICD-10-CM | POA: Diagnosis not present

## 2020-03-21 DIAGNOSIS — R0789 Other chest pain: Secondary | ICD-10-CM | POA: Diagnosis not present

## 2020-03-21 LAB — CBC
HCT: 42.8 % (ref 39.0–52.0)
Hemoglobin: 14.8 g/dL (ref 13.0–17.0)
MCH: 32.5 pg (ref 26.0–34.0)
MCHC: 34.6 g/dL (ref 30.0–36.0)
MCV: 93.9 fL (ref 80.0–100.0)
Platelets: 178 10*3/uL (ref 150–400)
RBC: 4.56 MIL/uL (ref 4.22–5.81)
RDW: 11.9 % (ref 11.5–15.5)
WBC: 7.4 10*3/uL (ref 4.0–10.5)
nRBC: 0 % (ref 0.0–0.2)

## 2020-03-21 LAB — BASIC METABOLIC PANEL
Anion gap: 9 (ref 5–15)
BUN: 16 mg/dL (ref 8–23)
CO2: 22 mmol/L (ref 22–32)
Calcium: 8.5 mg/dL — ABNORMAL LOW (ref 8.9–10.3)
Chloride: 107 mmol/L (ref 98–111)
Creatinine, Ser: 0.95 mg/dL (ref 0.61–1.24)
GFR, Estimated: >60 mL/min (ref >60)
Glucose, Bld: 86 mg/dL (ref 70–99)
Potassium: 4.1 mmol/L (ref 3.5–5.1)
Sodium: 138 mmol/L (ref 135–145)

## 2020-03-21 MED ORDER — AMLODIPINE BESYLATE 2.5 MG PO TABS: 2.5000 mg | ORAL_TABLET | Freq: Every day | ORAL | 3 refills | Status: DC

## 2020-03-21 MED FILL — Verapamil HCl IV Soln 2.5 MG/ML: INTRAVENOUS | Qty: 2 | Status: AC

## 2020-03-21 NOTE — Discharge Instructions (Signed)

## 2020-03-21 NOTE — Discharge Summary (Signed)
Physician Discharge Summary  Casey Cummings WUJ:[id number] DOB: 1955-12-31 DOA: 03/19/2020  PCP: Casey Oman, DO  Admit date: 03/19/2020 Discharge date: 03/21/2020  Admitted From: Home  Disposition:  Home   Recommendations for Outpatient Follow-up:  1. Follow up with Dr. Percival Cummings, Cardiology as directed     Home Health: None  Equipment/Devices: None new  Discharge Condition: Good  CODE STATUS: FULL Diet recommendation: Cardiac  Brief/Interim Summary: Mr. Casey Cummings is a 65 y.o. M with history STEMI in 2019 otherwise healthy who presented with substernal chest discomfort.    This was similar in characater to previous angina and seemed to be progressing, but nonexertional.  In the ER, troponins were negative, ECG unremarkable.       PRINCIPAL HOSPITAL DIAGNOSIS: Coronary vasospasm    Discharge Diagnoses:   Coronary vasospasm Coronary artery disease, secondary prevention Patient admitted and observed.  Taken to cath lab for [facility] which showed scattered nonobstructive disease but some vasospasm was observed after catheter engagement of the RCA, which resolved with nitroglycerin.  Given BP trend, his metoprolol was stopped and he was started on amlodipine, which he tolerated overnight. Feeling well this morning.  Discharged today with close Cardiology follow up on new amlodipine.         Discharge Instructions  Discharge Instructions    Diet - low sodium heart healthy   Complete by: As directed    Discharge instructions   Complete by: As directed    From Dr. Loleta Cummings: You were admitted for chest pain.  Here, your heart cath showed no evidence of worsening blockage of the arteries.   You DID have a "coronary vasospasm" during the procedure, which implies that your symptoms were related to that.  There are several vasodilators (like long acting versions of nitroglycerin, or others with similar action) that are used to treat vasospasm like this  One is  amlodipine  We recommend you STOP metoprolol and START amlodipine Take amlodipine 2.5 mg once daily  Dr. Percival Cummings has sent a message to his office to schedule you, but you may also just call them today to establish a follow up appointment in a few weeks to see how things are going on this new medicine   Increase activity slowly   Complete by: As directed      Allergies as of 03/21/2020   No Known Allergies     Medication List    STOP taking these medications   metoprolol succinate 25 MG 24 hr tablet Commonly known as: TOPROL-XL     TAKE these medications   amLODipine 2.5 MG tablet Commonly known as: NORVASC Take 1 tablet (2.5 mg total) by mouth daily. Start taking on: March 22, 2020   aspirin 81 MG chewable tablet Chew 1 tablet (81 mg total) by mouth daily.   multivitamin tablet Take 1 tablet by mouth daily.   nitroGLYCERIN 0.4 MG SL tablet Commonly known as: NITROSTAT Place 1 tablet (0.4 mg total) under the tongue every 5 (five) minutes as needed for chest pain.   rosuvastatin 40 MG tablet Commonly known as: CRESTOR Take 40 mg by mouth at bedtime.   VITAMIN D2 PO Take 2,000 Units by mouth every other day.       Follow-up Information    Casey Breeding, MD Follow up in 2 week(s).   Specialty: Cardiology Contact information: [address] [place] [address] [phone]              No Known Allergies  Consultations:  Cardiology   Procedures/Studies: DG Chest 2 View  Result Date: 03/19/2020 CLINICAL DATA:  64 year old male with chest pain. EXAM: CHEST - 2 VIEW COMPARISON:  Chest radiograph dated 08/14/2017. FINDINGS: No focal consolidation, pleural effusion or pneumothorax. The cardiac silhouette is within limits. No acute osseous pathology. IMPRESSION: No active cardiopulmonary disease. Electronically Signed   By: Casey Cummings M.D.   On: 03/19/2020 22:44   CARDIAC CATHETERIZATION  Result Date: 03/20/2020  Mid LAD to Dist  LAD lesion is 40% stenosed.  Previously placed Prox LAD drug eluting stent is widely patent.  Balloon angioplasty was performed.  Ost LM to Mid LM lesion is 30% stenosed.  Ost RCA to Prox RCA lesion is 20% stenosed.  Mid RCA lesion is 20% stenosed.  The left ventricular systolic function is normal.  LV end diastolic pressure is normal.  The left ventricular ejection fraction is greater than 65% by visual estimate.  There is no mitral valve regurgitation.  Mild diffuse left main artery stenosis, unchanged in appearance from cath in 2019. Patent proximal LAD stent without restenosis No obstructive disease in the Circumflex artery Mild non-obstructive disease in the proximal and mid RCA. Spasm noted in the proximal RCA after initial catheter engagement. This resolved with IC NTG. Normal LV systolic function. Recommendations: Continue medical management of CAD. Consider addition of long acting nitrate or Calcium channel blocker for possible coronary spasm.       Subjective: Feeling well.  Appetite good.  No chest pain.     Discharge Exam: Vitals:   03/21/20 0747 03/21/20 0814  BP: 116/72   Pulse: 79   Resp: 16   Temp:  98 F (36.7 C)  SpO2: 96%    Vitals:   03/20/20 1815 03/21/20 0415 03/21/20 0747 03/21/20 0814  BP: (!) 98/57 106/71 116/72   Pulse: (!) 52 68 79   Resp: 13 15 16    Temp:  98.2 F (36.8 C)  98 F (36.7 C)  TempSrc:  Oral    SpO2: 96% 97% 96%   Weight:  73.3 kg    Height:        General: Pt is alert, awake, not in acute distress Cardiovascular: RRR, nl S1-S2, no murmurs appreciated.   No LE edema.   Respiratory: Normal respiratory rate and rhythm.  CTAB without rales or wheezes. Abdominal: Abdomen soft and non-tender.  No distension or HSM.   Neuro/Psych: Strength symmetric in upper and lower extremities.  Judgment and insight appear normal.   The results of significant diagnostics from this hospitalization (including imaging, microbiology, ancillary and  laboratory) are listed below for reference.     Microbiology: Recent Results (from the past 240 hour(s))  Resp Panel by RT-PCR (Flu A&B, Covid) Nasopharyngeal Swab     Status: None   Collection Time: 03/20/20 12:38 AM   Specimen: Nasopharyngeal Swab; Nasopharyngeal(NP) swabs in vial transport medium  Result Value Ref Range Status   SARS Coronavirus 2 by RT PCR NEGATIVE NEGATIVE Final    Comment: (NOTE) SARS-CoV-2 target nucleic acids are NOT DETECTED.  The SARS-CoV-2 RNA is generally detectable in upper respiratory specimens during the acute phase of infection. The lowest concentration of SARS-CoV-2 viral copies this assay can detect is 138 copies/mL. A negative result does not preclude SARS-Cov-2 infection and should not be used as the sole basis for treatment or other patient management decisions. A negative result may occur with  improper specimen collection/handling, submission of specimen other than nasopharyngeal swab, presence of viral mutation(s) within  the areas targeted by this assay, and inadequate number of viral copies(<138 copies/mL). A negative result must be combined with clinical observations, patient history, and epidemiological information. The expected result is Negative.  Fact Sheet for Patients:  EntrepreneurPulse.com.au  Fact Sheet for Healthcare Providers:  IncredibleEmployment.be  This test is no t yet approved or cleared by the [place] FDA and  has been authorized for detection and/or diagnosis of SARS-CoV-2 by FDA under an Emergency Use Authorization (EUA). This EUA will remain  in effect (meaning this test can be used) for the duration of the COVID-19 declaration under Section 564(b)(1) of the Act, 21 U.S.C.section 360bbb-3(b)(1), unless the authorization is terminated  or revoked sooner.       Influenza A by PCR NEGATIVE NEGATIVE Final   Influenza B by PCR NEGATIVE NEGATIVE Final    Comment: (NOTE) The  Xpert Xpress SARS-CoV-2/FLU/RSV plus assay is intended as an aid in the diagnosis of influenza from Nasopharyngeal swab specimens and should not be used as a sole basis for treatment. Nasal washings and aspirates are unacceptable for Xpert Xpress SARS-CoV-2/FLU/RSV testing.  Fact Sheet for Patients: EntrepreneurPulse.com.au  Fact Sheet for Healthcare Providers: IncredibleEmployment.be  This test is not yet approved or cleared by the [place] FDA and has been authorized for detection and/or diagnosis of SARS-CoV-2 by FDA under an Emergency Use Authorization (EUA). This EUA will remain in effect (meaning this test can be used) for the duration of the COVID-19 declaration under Section 564(b)(1) of the Act, 21 U.S.C. section 360bbb-3(b)(1), unless the authorization is terminated or revoked.  Performed at [facility], [place] [address], [place], [place] [address]      Labs: BNP (last 3 results) No results for input(s): BNP in the last 8760 hours. Basic Metabolic Panel: Recent Labs  Lab 03/19/20 2239 03/20/20 0451 03/21/20 0244  NA 138  --  138  K 3.7  --  4.1  CL 104  --  107  CO2 24  --  22  GLUCOSE 104*  --  86  BUN 14  --  16  CREATININE 1.37* 1.00 0.95  CALCIUM 9.4  --  8.5*   Liver Function Tests: No results for input(s): AST, ALT, ALKPHOS, BILITOT, PROT, ALBUMIN in the last 168 hours. No results for input(s): LIPASE, AMYLASE in the last 168 hours. No results for input(s): AMMONIA in the last 168 hours. CBC: Recent Labs  Lab 03/19/20 2239 03/20/20 0451 03/21/20 0244  WBC 5.7 5.5 7.4  HGB 16.1 15.0 14.8  HCT 48.3 45.5 42.8  MCV 94.9 96.6 93.9  PLT 204 176 178   Cardiac Enzymes: No results for input(s): CKTOTAL, CKMB, CKMBINDEX, TROPONINI in the last 168 hours. BNP: Invalid input(s): POCBNP CBG: No results for input(s): GLUCAP in the last 168 hours. D-Dimer Recent Labs    03/20/20 0726  DDIMER <0.27    Hgb A1c No results for input(s): HGBA1C in the last 72 hours. Lipid Profile No results for input(s): CHOL, HDL, LDLCALC, TRIG, CHOLHDL, LDLDIRECT in the last 72 hours. Thyroid function studies No results for input(s): TSH, T4TOTAL, T3FREE, THYROIDAB in the last 72 hours.  Invalid input(s): FREET3 Anemia work up No results for input(s): VITAMINB12, FOLATE, FERRITIN, TIBC, IRON, RETICCTPCT in the last 72 hours. Urinalysis No results found for: COLORURINE, APPEARANCEUR, LABSPEC, [place], GLUCOSEU, [place], BILIRUBINUR, KETONESUR, PROTEINUR, UROBILINOGEN, NITRITE, LEUKOCYTESUR Sepsis Labs Invalid input(s): PROCALCITONIN,  WBC,  LACTICIDVEN Microbiology Recent Results (from the past 240 hour(s))  Resp Panel by RT-PCR (  Flu A&B, Covid) Nasopharyngeal Swab     Status: None   Collection Time: 03/20/20 12:38 AM   Specimen: Nasopharyngeal Swab; Nasopharyngeal(NP) swabs in vial transport medium  Result Value Ref Range Status   SARS Coronavirus 2 by RT PCR NEGATIVE NEGATIVE Final    Comment: (NOTE) SARS-CoV-2 target nucleic acids are NOT DETECTED.  The SARS-CoV-2 RNA is generally detectable in upper respiratory specimens during the acute phase of infection. The lowest concentration of SARS-CoV-2 viral copies this assay can detect is 138 copies/mL. A negative result does not preclude SARS-Cov-2 infection and should not be used as the sole basis for treatment or other patient management decisions. A negative result may occur with  improper specimen collection/handling, submission of specimen other than nasopharyngeal swab, presence of viral mutation(s) within the areas targeted by this assay, and inadequate number of viral copies(<138 copies/mL). A negative result must be combined with clinical observations, patient history, and epidemiological information. The expected result is Negative.  Fact Sheet for Patients:  EntrepreneurPulse.com.au  Fact Sheet for Healthcare  Providers:  IncredibleEmployment.be  This test is no t yet approved or cleared by the [place] FDA and  has been authorized for detection and/or diagnosis of SARS-CoV-2 by FDA under an Emergency Use Authorization (EUA). This EUA will remain  in effect (meaning this test can be used) for the duration of the COVID-19 declaration under Section 564(b)(1) of the Act, 21 U.S.C.section 360bbb-3(b)(1), unless the authorization is terminated  or revoked sooner.       Influenza A by PCR NEGATIVE NEGATIVE Final   Influenza B by PCR NEGATIVE NEGATIVE Final    Comment: (NOTE) The Xpert Xpress SARS-CoV-2/FLU/RSV plus assay is intended as an aid in the diagnosis of influenza from Nasopharyngeal swab specimens and should not be used as a sole basis for treatment. Nasal washings and aspirates are unacceptable for Xpert Xpress SARS-CoV-2/FLU/RSV testing.  Fact Sheet for Patients: EntrepreneurPulse.com.au  Fact Sheet for Healthcare Providers: IncredibleEmployment.be  This test is not yet approved or cleared by the [place] FDA and has been authorized for detection and/or diagnosis of SARS-CoV-2 by FDA under an Emergency Use Authorization (EUA). This EUA will remain in effect (meaning this test can be used) for the duration of the COVID-19 declaration under Section 564(b)(1) of the Act, 21 U.S.C. section 360bbb-3(b)(1), unless the authorization is terminated or revoked.  Performed at [facility], [place] [address] [place], [place] [address]      Time coordinating discharge: 25 minutes      SIGNED:   Edwin Dada, MD  [facility] 03/21/2020, 8:23 AM

## 2020-03-21 NOTE — Progress Notes (Signed)
Progress Note  Patient Name: Casey Cummings Date of Encounter: 03/21/2020  [facility] Cardiologist: Minus Breeding, MD   Subjective   No further chest pain.  No SOB.   Inpatient Medications    Scheduled Meds: . amLODipine  2.5 mg Oral Daily  . aspirin  81 mg Oral Daily  . enoxaparin (LOVENOX) injection  40 mg Subcutaneous Q24H  . metoprolol succinate  12.5 mg Oral QHS  . rosuvastatin  40 mg Oral QHS  . sodium chloride flush  3 mL Intravenous Q12H   Continuous Infusions: . sodium chloride     PRN Meds: sodium chloride, acetaminophen **OR** acetaminophen, nitroGLYCERIN, ondansetron (ZOFRAN) IV, sodium chloride flush   Vital Signs    Vitals:   03/20/20 1745 03/20/20 1800 03/20/20 1815 03/21/20 0415  BP: (!) 94/56 (!) 92/56 (!) 98/57 106/71  Pulse: (!) 55 (!) 54 (!) 52 68  Resp: 16 15 13 15   Temp:    98.2 F (36.8 C)  TempSrc:    Oral  SpO2: 95% 96% 96% 97%  Weight:    73.3 kg  Height:        Intake/Output Summary (Last 24 hours) at 03/21/2020 0809 Last data filed at 03/21/2020 0300 Gross per 24 hour  Intake --  Output 400 ml  Net -400 ml   Last 3 Weights 03/21/2020 03/20/2020 07/17/2019  Weight (lbs) 161 lb 11.2 oz 163 lb 168 lb  Weight (kg) 73.347 kg 73.936 kg 76.204 kg      Telemetry    NSR - Personally Reviewed  ECG    NA - Personally Reviewed  Physical Exam   GEN: No acute distress.   Neck: No JVD Cardiac: RRR, no murmurs, rubs, or gallops.  Respiratory: Clear to auscultation bilaterally. GI: Soft, nontender, non-distended  MS: No edema; No deformity. No groin hematoma or echymosis. Neuro:  Nonfocal  Psych: Normal affect   Labs    High Sensitivity Troponin:   Recent Labs  Lab 03/19/20 2239 03/20/20 0035 03/20/20 0451  TROPONINIHS 3 4 4       Chemistry Recent Labs  Lab 03/19/20 2239 03/20/20 0451 03/21/20 0244  NA 138  --  138  K 3.7  --  4.1  CL 104  --  107  CO2 24  --  22  GLUCOSE 104*  --  86  BUN 14  --  16   CREATININE 1.37* 1.00 0.95  CALCIUM 9.4  --  8.5*  GFRNONAA 58* >60 >60  ANIONGAP 10  --  9     Hematology Recent Labs  Lab 03/19/20 2239 03/20/20 0451 03/21/20 0244  WBC 5.7 5.5 7.4  RBC 5.09 4.71 4.56  HGB 16.1 15.0 14.8  HCT 48.3 45.5 42.8  MCV 94.9 96.6 93.9  MCH 31.6 31.8 32.5  MCHC 33.3 33.0 34.6  RDW 11.6 11.8 11.9  PLT 204 176 178    BNPNo results for input(s): BNP, PROBNP in the last 168 hours.   DDimer  Recent Labs  Lab 03/20/20 0726  DDIMER <0.27     Radiology    DG Chest 2 View  Result Date: 03/19/2020 CLINICAL DATA:  65 year old male with chest pain. EXAM: CHEST - 2 VIEW COMPARISON:  Chest radiograph dated 08/14/2017. FINDINGS: No focal consolidation, pleural effusion or pneumothorax. The cardiac silhouette is within limits. No acute osseous pathology. IMPRESSION: No active cardiopulmonary disease. Electronically Signed   By: Anner Crete M.D.   On: 03/19/2020 22:44   CARDIAC CATHETERIZATION  Result Date: 03/20/2020  Mid LAD to Dist LAD lesion is 40% stenosed.  Previously placed Prox LAD drug eluting stent is widely patent.  Balloon angioplasty was performed.  Ost LM to Mid LM lesion is 30% stenosed.  Ost RCA to Prox RCA lesion is 20% stenosed.  Mid RCA lesion is 20% stenosed.  The left ventricular systolic function is normal.  LV end diastolic pressure is normal.  The left ventricular ejection fraction is greater than 65% by visual estimate.  There is no mitral valve regurgitation.  Mild diffuse left main artery stenosis, unchanged in appearance from cath in 2019. Patent proximal LAD stent without restenosis No obstructive disease in the Circumflex artery Mild non-obstructive disease in the proximal and mid RCA. Spasm noted in the proximal RCA after initial catheter engagement. This resolved with IC NTG. Normal LV systolic function. Recommendations: Continue medical management of CAD. Consider addition of long acting nitrate or Calcium channel  blocker for possible coronary spasm.    Cardiac Studies   CATH:   Mid LAD to Dist LAD lesion is 40% stenosed.  Previously placed Prox LAD drug eluting stent is widely patent.  Balloon angioplasty was performed.  Ost LM to Mid LM lesion is 30% stenosed.  Ost RCA to Prox RCA lesion is 20% stenosed.  Mid RCA lesion is 20% stenosed.  The left ventricular systolic function is normal.  LV end diastolic pressure is normal.  The left ventricular ejection fraction is greater than 65% by visual estimate.  There is no mitral valve regurgitation.   Mild diffuse left main artery stenosis, unchanged in appearance from cath in 2019.  Patent proximal LAD stent without restenosis No obstructive disease in the Circumflex artery Mild non-obstructive disease in the proximal and mid RCA. Spasm noted in the proximal RCA after initial catheter engagement. This resolved with IC NTG.  Normal LV systolic function.    Patient Profile     65 y.o. male with CAD s/p PCI 2019 who presented with chest pain.  This was progressing over a few days, present mostly at rest, but reminiscent of his prior angina.   Assessment & Plan    CHEST PAIN:  Patent stent.  Non obstructive disease.  Coronary spasm.  Now on amlodipine.  Discussed with Dr. Loleta Books.  Given lower BP with beta blocker and Norvasc I would discharge off of beta blocker.  We will arrange follow up.       For questions or updates, please contact [place] Please consult [email or web address] for contact info under        Signed, Minus Breeding, MD  03/21/2020, 8:09 AM

## 2020-04-05 ENCOUNTER — Ambulatory Visit: Payer: Managed Care, Other (non HMO) | Admitting: Emergency Medicine

## 2020-04-09 ENCOUNTER — Ambulatory Visit (INDEPENDENT_AMBULATORY_CARE_PROVIDER_SITE_OTHER): Payer: Managed Care, Other (non HMO) | Admitting: Emergency Medicine

## 2020-04-09 ENCOUNTER — Other Ambulatory Visit: Payer: Self-pay

## 2020-04-09 ENCOUNTER — Encounter: Payer: Self-pay | Admitting: Emergency Medicine

## 2020-04-09 DIAGNOSIS — R9389 Abnormal findings on diagnostic imaging of other specified body structures: Secondary | ICD-10-CM

## 2020-04-09 DIAGNOSIS — J449 Chronic obstructive pulmonary disease, unspecified: Secondary | ICD-10-CM

## 2020-04-09 NOTE — Assessment & Plan Note (Signed)
Mild obstruction noted on this pulmonary function testing.  Now with dyspnea, question whether this was related to cardiac disease, vasospasm.  His beta-blocker has been changed to a calcium channel blocker and nitrate.  Some of his symptoms sound like they may be upper airway in nature-cough, losing his voice, difficulty with talking, singing.  Chest x-ray 03/19/2020 was clear.  At this point would follow his dyspnea with change in his cardiac medications.  If dyspnea worsens or does not resolve then I think he needs repeat pulmonary function testing to quantify his degree of obstruction, compare to prior.  Would hold off on bronchodilators right now

## 2020-04-09 NOTE — Patient Instructions (Addendum)
We will hold off on starting any inhaled medications at this time  Depending on how your breathing progresses we may decide to repeat your Pulmonary Function Testing  We do not need to repeat your CT chest at this time.  Follow with Dr. Lamonte Sakai in 12 months or sooner if you have any changes in your breathing or cough.

## 2020-04-09 NOTE — Assessment & Plan Note (Signed)
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  Could consider repeating to look for interval change depending on his clinical course.  His chest x-ray from 03/19/2020 was reassuring, no infiltrates.

## 2020-04-09 NOTE — Progress Notes (Signed)
Subjective:    Patient ID: Casey Cummings, male    DOB: 08/15/1955, 65 y.o.   MRN: [id number]  HPI 65 year old never smoker with a history of coronary artery disease, PTCI.  He also has hyperlipidemia.  ROV 02/08/18 --Casey Cummings is 65 and has a history of coronary artery disease.  We have followed him for some biapical airspace disease noted on CT scans of the chest 06/24/2017 but going back to a CT neck from 01/2016.  He has had negative PPD and QuantiFERON gold (has a significant travel history).  His follow-up scan was done on 02/02/2018 and I have reviewed.  This shows similar appearance with some biapical pleural-parenchymal scarring more so on the right than on the left, some mild subpleural reticulation in the left lower lobe but no new nodules, new findings, infiltrates.  He is feeling well, good functional capacity.  No new pulmonary symptoms.  He does have occasional cough, complains of some intermittent chest wall burning that he thinks may be musculoskeletal.  He does not think his GERD.  ROV 02/07/2019 --this a follow-up visit for 65 year old man with a history of abnormal CT scan of the chest - biapical scar with some traction btx. Negative PPD and Quant-gold. We repeated his Ct chest 11/30 and I have reviewed, shows persistent biapical scar and some calcified granulomas without any changes compared with prior. No new worrisome nodules. 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 No BD response. He has some occasional throat clearing and cough. No significant dyspnea - he is able to hike and exert himself.    ROV 04/09/2020 --65 year old man with a history of CAD, diastolic dysfxn and hyperlipidemia.  Follows up for an abnormal CT scan of the chest that shows some bilateral apical scarring and traction bronchiectasis, calcified granulomatous disease, stable on his most recent CT 02/06/2020.  He also has some possible mild  airflow obstruction on pulmonary function testing although flow volume loop appears normal.  He returns today reporting that he was hospitalized in mid January with chest discomfort and dyspnea, often w exertion.  Left heart catheterization showed mid LAD to distal LAD stenosis with a patent stent, angioplasty was performed, scattered noncritical CAD elsewhere, normal LV function and LVEDP. There was some vascular spasming - his b-blocker was changed to Ca blocker and nitrate. He has had some increased SOB with talking, singing. Some difficulty taking a deep breath. He has daily cough, can lose his voice at times.    Chest x-ray 03/19/2020 reviewed by me, showed no infiltrate or active disease.  MDM: Reviewed hospital admission notes from 1/12-1/13/2022    Review of Systems  Constitutional: Negative for fever and unexpected weight change.  HENT: Negative for congestion, dental problem, ear pain, nosebleeds, postnasal drip, rhinorrhea, sinus pressure, sneezing, sore throat and trouble swallowing.   Eyes: Negative for redness and itching.  Respiratory: Positive for cough and shortness of breath. Negative for chest tightness and wheezing.   Cardiovascular: Positive for chest pain. Negative for palpitations and leg swelling.  Gastrointestinal: Negative for nausea and vomiting.  Genitourinary: Negative for dysuria.  Musculoskeletal: Negative for joint swelling.  Skin: Negative for rash.  Neurological: Negative for headaches.  Hematological: Does not bruise/bleed easily.  Psychiatric/Behavioral: Negative for dysphoric mood. The patient is not nervous/anxious.     Past Medical History:  Diagnosis Date  . CAD (coronary artery disease)    a. 07/2017 s/p Ant STEMI/PCI: initially  admitted w/ chest pain->Cardiac CTA: Ca2+ 0, >70%LAD stenosis->pt later dev Ant STEMI->Cath: LM nl, LAD 90p (3.5x16 Synergy DES), 29m, LCX nl, RCA nl.  . Diastolic dysfunction    a. 07/2017 Echo: EF 65-70%, mild LVH, Gr1 DD.   Marland Kitchen Dyslipidemia, goal LDL below 70   . Pulmonary nodules    a. 06/2017 Chest CT [facility]): biapical airspace dzs w/ more nodular focus in RUL w/ some associated bronchiectasis and prominent pleural-parenchymal scarring (seen by Pulm - similar findings on CT neck in 01/2016).     Family History  Problem Relation Age of Onset  . CAD Mother      Social History   Socioeconomic History  . Marital status: Married    Spouse name: Not on file  . Number of children: Not on file  . Years of education: Not on file  . Highest education level: Not on file  Occupational History  . 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 Smoking status: Never Smoker  . Smokeless tobacco: Never Used  Substance and Sexual Activity  . Alcohol use: Yes    Comment: Occasionally.  . Drug use: Never  . Sexual activity: Not on file  Other Topics Concern  . Not on file  Social History Narrative   Lives locally with wife.   Social Determinants of Health   Financial Resource Strain: Not on file  Food Insecurity: Not on file  Transportation Needs: Not on file  Physical Activity: Not on file  Stress: Not on file  Social Connections: Not on file  Intimate Partner Violence: Not on file   Just moved from [place]. Has also lived in [place], Rep of [place], has worked with [place]. 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 No malignancy in family.    No Known Allergies   Outpatient Medications Prior to Visit  Medication Sig Dispense Refill  . amLODipine (NORVASC) 2.5 MG tablet Take 1 tablet (2.5 mg total) by mouth daily. 30 tablet 3  . aspirin 81 MG chewable tablet Chew 1 tablet (81 mg total) by mouth daily. 90 tablet 3  . Ergocalciferol (VITAMIN D2 PO) Take 2,000 Units by mouth every other day.    . Multiple Vitamin (MULTIVITAMIN) tablet Take 1 tablet by mouth daily.    . nitroGLYCERIN  (NITROSTAT) 0.4 MG SL tablet Place 1 tablet (0.4 mg total) under the tongue every 5 (five) minutes as needed for chest pain. 25 tablet 3  . rosuvastatin (CRESTOR) 40 MG tablet Take 40 mg by mouth at bedtime.     No facility-administered medications prior to visit.       Objective:   Physical Exam Vitals:   04/09/20 1400  BP: 120/68  Pulse: 73  Temp: (!) 97.1 F (36.2 C)  SpO2: 99%  Weight: 167 lb 9.6 oz (76 kg)  Height: 5\' 10"  (1.778 m)   Gen: Pleasant, well-nourished, in no distress,  normal affect  ENT: No lesions,  mouth clear,  oropharynx clear, no postnasal drip  Neck: No JVD, no stridor  Lungs: No use of accessory muscles, clear bilaterally with no crackles, no wheezes  Cardiovascular: RRR, heart sounds normal, no murmur or gallops, no peripheral edema  Musculoskeletal: No deformities, no cyanosis or clubbing  Neuro: alert, non focal  Skin: Warm, no lesions or rash      Assessment & Plan:  Obstructive lung disease (  generalized) ([place]) Mild obstruction noted on this pulmonary function testing.  Now with dyspnea, question whether this was related to cardiac disease, vasospasm.  His beta-blocker has been changed to a calcium channel blocker and nitrate.  Some of his symptoms sound like they may be upper airway in nature-cough, losing his voice, difficulty with talking, singing.  Chest x-ray 03/19/2020 was clear.  At this point would follow his dyspnea with change in his cardiac medications.  If dyspnea worsens or does not resolve then I think he needs repeat pulmonary function testing to quantify his degree of obstruction, compare to prior.  Would hold off on bronchodilators right now  Abnormal CT of the chest Stable granulomatous disease on CT chest.  Could consider repeating to look for interval change depending on his clinical course.  His chest x-ray from 03/19/2020 was reassuring, no infiltrates.  Baltazar Apo, MD, PhD 04/09/2020, 5:33 PM [facility] [phone] or if no answer [phone]

## 2020-04-24 NOTE — Progress Notes (Signed)
Cardiology Office Note   Date:  04/25/2020   ID:  Casey Cummings, DOB 1955-11-14, MRN [id number]  PCP:  Francesca Oman, DO  Cardiologist:   Minus Breeding, MD   Chief Complaint  Patient presents with  . Chest Pain      History of Present Illness: Casey Cummings is a 65 y.o. male who presents for follow up of NSTEMI in May of 2019.  He had a DES to the LAD.  He was in the hospital recently with chest pain.  He had non obstructive CAD on cath.  He returns for follow up.  Since he was in the hospital he has done well.  The patient denies any new symptoms such as chest discomfort, neck or arm discomfort. There has been no new shortness of breath, PND or orthopnea. There have been no reported palpitations, presyncope or syncope. He and his wife thinks that he feels much better on amlodipine than he did on beta blockers   Past Medical History:  Diagnosis Date  . CAD (coronary artery disease)    a. 07/2017 s/p Ant STEMI/PCI: initially admitted w/ chest pain->Cardiac CTA: Ca2+ 0, >70%LAD stenosis->pt later dev Ant STEMI->Cath: LM nl, LAD 90p (3.5x16 Synergy DES), 66m, LCX nl, RCA nl.  . Diastolic dysfunction    a. 07/2017 Echo: EF 65-70%, mild LVH, Gr1 DD.  Marland Kitchen Dyslipidemia, goal LDL below 70   . Pulmonary nodules    a. 06/2017 Chest CT [facility]): biapical airspace dzs w/ more nodular focus in RUL w/ some associated bronchiectasis and prominent pleural-parenchymal scarring (seen by Pulm - similar findings on CT neck in 01/2016).    Past Surgical History:  Procedure Laterality Date  . CORONARY/GRAFT ACUTE MI REVASCULARIZATION N/A 07/08/2017   Procedure: CORONARY/GRAFT ACUTE MI REVASCULARIZATION;  Surgeon: Leonie Man, MD;  Location: [place] [facility];  Service: Cardiovascular;  Laterality: N/A;  . LEFT HEART CATH AND CORONARY ANGIOGRAPHY N/A 07/08/2017   Procedure: LEFT HEART CATH AND CORONARY ANGIOGRAPHY;  Surgeon: Leonie Man, MD;  Location: [place] [facility];  Service:  Cardiovascular;  Laterality: N/A;  . LEFT HEART CATH AND CORONARY ANGIOGRAPHY N/A 03/20/2020   Procedure: LEFT HEART CATH AND CORONARY ANGIOGRAPHY;  Surgeon: Burnell Blanks, MD;  Location: [place] [facility];  Service: Cardiovascular;  Laterality: N/A;     Current Outpatient Medications  Medication Sig Dispense Refill  . aspirin 81 MG chewable tablet Chew 1 tablet (81 mg total) by mouth daily. 90 tablet 3  . Ergocalciferol (VITAMIN D2 PO) Take 2,000 Units by mouth every other day.    . Multiple Vitamin (MULTIVITAMIN) tablet Take 1 tablet by mouth daily.    . nitroGLYCERIN (NITROSTAT) 0.4 MG SL tablet Place 1 tablet (0.4 mg total) under the tongue every 5 (five) minutes as needed for chest pain. 25 tablet 3  . rosuvastatin (CRESTOR) 40 MG tablet Take 40 mg by mouth at bedtime.    Marland Kitchen amLODipine (NORVASC) 2.5 MG tablet Take 1 tablet (2.5 mg total) by mouth daily. 30 tablet 11   No current facility-administered medications for this visit.    Allergies:   Patient has no known allergies.    ROS:  Please see the history of present illness.   Otherwise, review of systems are positive for none.   All other systems are reviewed and negative.    PHYSICAL EXAM: VS:  BP 128/80   Pulse 75   Ht 5\' 10"  (1.778 m)   Wt 169  lb 6.4 oz (76.8 kg)   SpO2 99%   BMI 24.31 kg/m  , BMI Body mass index is 24.31 kg/m. GENERAL:  Well appearing NECK:  No jugular venous distention, waveform within normal limits, carotid upstroke brisk and symmetric, no bruits, no thyromegaly LUNGS:  Clear to auscultation bilaterally CHEST:  Unremarkable HEART:  PMI not displaced or sustained,S1 and S2 within normal limits, no S3, no S4, no clicks, no rubs, no murmurs ABD:  Flat, positive bowel sounds normal in frequency in pitch, no bruits, no rebound, no guarding, no midline pulsatile mass, no hepatomegaly, no splenomegaly EXT:  2 plus pulses throughout, no edema, no cyanosis no clubbing   EKG:  EKG is  ordered  today. The ekg ordered today demonstrates sinus rhythm, rate 75, axis within normal limits, intervals within normal limits, no acute ST-T wave changes.   Recent Labs: 08/15/2019: ALT 48 03/21/2020: BUN 16; Creatinine, Ser 0.95; Hemoglobin 14.8; Platelets 178; Potassium 4.1; Sodium 138    Lipid Panel    Component Value Date/Time   CHOL 113 08/15/2019 0842   TRIG 81 08/15/2019 0842   HDL 53 08/15/2019 0842   CHOLHDL 2.1 08/15/2019 0842   CHOLHDL 3.6 07/08/2017 1244   VLDL 25 07/08/2017 1244   LDLCALC 44 08/15/2019 0842      Wt Readings from Last 3 Encounters:  04/25/20 169 lb 6.4 oz (76.8 kg)  04/09/20 167 lb 9.6 oz (76 kg)  03/21/20 161 lb 11.2 oz (73.3 kg)      Other studies Reviewed: Additional studies/ records that were reviewed today include:  None. Review of the above records demonstrates:  NA   ASSESSMENT AND PLAN:  CAD:The patient has no new sypmtoms.  No further cardiovascular testing is indicated.  We will continue with aggressive risk reduction and meds as listed.  DYSLIPIDEMIA : He has done very well on Crestor and is at goal.  No change in therapy.   Current medicines are reviewed at length with the patient today.  The patient does not have concerns regarding medicines.  The following changes have been made:  None  Labs/ tests ordered today include:   Orders Placed This Encounter  Procedures  . EKG 12-Lead     Disposition:   FU with me in 12 months.    Signed, Minus Breeding, MD  04/25/2020 10:02 AM    [place]

## 2020-04-25 ENCOUNTER — Encounter: Payer: Self-pay | Admitting: Cardiology

## 2020-04-25 ENCOUNTER — Ambulatory Visit (INDEPENDENT_AMBULATORY_CARE_PROVIDER_SITE_OTHER): Payer: Managed Care, Other (non HMO) | Admitting: Cardiology

## 2020-04-25 ENCOUNTER — Other Ambulatory Visit: Payer: Self-pay

## 2020-04-25 VITALS — BP 128/80 | HR 75 | Ht 70.0 in | Wt 169.4 lb

## 2020-04-25 DIAGNOSIS — I2510 Atherosclerotic heart disease of native coronary artery without angina pectoris: Secondary | ICD-10-CM

## 2020-04-25 DIAGNOSIS — E785 Hyperlipidemia, unspecified: Secondary | ICD-10-CM | POA: Diagnosis not present

## 2020-04-25 MED ORDER — AMLODIPINE BESYLATE 2.5 MG PO TABS: 2.5000 mg | ORAL_TABLET | Freq: Every day | ORAL | 11 refills | Status: AC

## 2020-04-25 NOTE — Patient Instructions (Signed)
Medication Instructions:  No change *If you need a refill on your cardiac medications before your next appointment, please call your pharmacy*  Follow-Up: At [facility], you and your health needs are our priority.  As part of our continuing mission to provide you with exceptional heart care, we have created designated Provider Care Teams.  These Care Teams include your primary Cardiologist (physician) and Advanced Practice Providers (APPs -  Physician Assistants and Nurse Practitioners) who all work together to provide you with the care you need, when you need it.   Your next appointment:   12 month(s) You will receive a reminder letter in the mail two months in advance. If you don't receive a letter, please call our office to schedule the follow-up appointment.  The format for your next appointment:   In Person  Provider:   Minus Breeding, MD

## 2020-06-05 ENCOUNTER — Other Ambulatory Visit: Payer: Self-pay

## 2020-06-05 ENCOUNTER — Encounter: Payer: Self-pay | Admitting: Physician Assistant

## 2020-06-05 ENCOUNTER — Ambulatory Visit (INDEPENDENT_AMBULATORY_CARE_PROVIDER_SITE_OTHER): Payer: Managed Care, Other (non HMO) | Admitting: Physician Assistant

## 2020-06-05 DIAGNOSIS — L570 Actinic keratosis: Secondary | ICD-10-CM

## 2020-06-05 DIAGNOSIS — L821 Other seborrheic keratosis: Secondary | ICD-10-CM

## 2020-06-05 DIAGNOSIS — Z1283 Encounter for screening for malignant neoplasm of skin: Secondary | ICD-10-CM | POA: Diagnosis not present

## 2020-06-05 DIAGNOSIS — Z86018 Personal history of other benign neoplasm: Secondary | ICD-10-CM | POA: Diagnosis not present

## 2020-06-05 DIAGNOSIS — D485 Neoplasm of uncertain behavior of skin: Secondary | ICD-10-CM

## 2020-06-05 DIAGNOSIS — Z87898 Personal history of other specified conditions: Secondary | ICD-10-CM

## 2020-06-05 DIAGNOSIS — L720 Epidermal cyst: Secondary | ICD-10-CM

## 2020-06-05 NOTE — Progress Notes (Addendum)
   Follow-Up Visit   Subjective  Casey Cummings is a 65 y.o. male who presents for the following: Annual Exam.   The following portions of the chart were reviewed this encounter and updated as appropriate:  Tobacco  Allergies  Meds  Problems  Med Hx  Surg Hx  Fam Hx      Objective  Well appearing patient in no apparent distress; mood and affect are within normal limits.  A full examination was performed including scalp, head, eyes, ears, nose, lips, neck, chest, axillae, abdomen, back, buttocks, bilateral upper extremities, bilateral lower extremities, hands, feet, fingers, toes, fingernails, and toenails. All findings within normal limits unless otherwise noted below.  Objective  Mid Back: Dyspigmented scar.   Objective  Left Inguinal Area: No atypical moles, no skin cancer  Objective  Left Shoulder - Anterior, Right Abdomen (side) - Lower: Brown crusts  Objective  Mid Parietal Scalp: Hyperkeratotic scale with pink base      Assessment & Plan  History of atypical nevus Mid Back  observe  Screening exam for skin cancer Left Inguinal Area  Yearly skin exam  Seborrheic keratosis (2) Left Shoulder - Anterior; Right Abdomen (side) - Lower  observe  Neoplasm of uncertain behavior of skin Mid Parietal Scalp  Skin / nail biopsy Type of biopsy: tangential   Informed consent: discussed and consent obtained   Timeout: patient name, date of birth, surgical site, and procedure verified   Procedure prep:  Patient was prepped and draped in usual sterile fashion (Non sterile) Prep type:  Chlorhexidine Anesthesia: the lesion was anesthetized in a standard fashion   Anesthetic:  1% lidocaine w/ epinephrine 1-100,000 local infiltration Instrument used: flexible razor blade   Outcome: patient tolerated procedure well   Post-procedure details: wound care instructions given    Specimen 1 - Surgical pathology Differential Diagnosis: bcc scc  Check Margins:  No   I, Kincaid Tiger, PA-C, have reviewed all documentation's for this visit.  The documentation on 06/11/20 for the exam, diagnosis, procedures and orders are all accurate and complete.

## 2020-06-05 NOTE — Patient Instructions (Addendum)
Seborrheic Keratosis A seborrheic keratosis is a common, noncancerous (benign) skin growth. These growths are velvety, waxy, rough, tan, brown, or black spots that appear on the skin. These skin growths can be flat or raised, and scaly. What are the causes? The cause of this condition is not known. What increases the risk? You are more likely to develop this condition if you:  Have a family history of seborrheic keratosis.  Are 50 or older.  Are pregnant.  Have had estrogen replacement therapy. What are the signs or symptoms? Symptoms of this condition include growths on the face, chest, shoulders, back, or other areas. These growths:  Are usually painless, but may become irritated and itchy.  Can be yellow, brown, black, or other colors.  Are slightly raised or have a flat surface.  Are sometimes rough or wart-like in texture.  Are often velvety or waxy on the surface.  Are round or oval-shaped.  Often occur in groups, but may occur as a single growth.   How is this diagnosed? This condition is diagnosed with a medical history and physical exam.  A sample of the growth may be tested (skin biopsy).  You may need to see a skin specialist (dermatologist). How is this treated? Treatment is not usually needed for this condition, unless the growths are irritated or bleed often.  You may also choose to have the growths removed if you do not like their appearance. ? Most commonly, these growths are treated with a procedure in which liquid nitrogen is applied to "freeze" off the growth (cryosurgery). ? They may also be burned off with electricity (electrocautery) or removed by scraping (curettage). Follow these instructions at home:  Watch your growth for any changes.  Keep all follow-up visits as told by your health care provider. This is important.  Do not scratch or pick at the growth or growths. This can cause them to become irritated or infected. Contact a health care  provider if:  You suddenly have many new growths.  Your growth bleeds, itches, or hurts.  Your growth suddenly becomes larger or changes color. Summary  A seborrheic keratosis is a common, noncancerous (benign) skin growth.  Treatment is not usually needed for this condition, unless the growths are irritated or bleed often.  Watch your growth for any changes.  Contact a health care provider if you suddenly have many new growths or your growth suddenly becomes larger or changes color.  Keep all follow-up visits as told by your health care provider. This is important. This information is not intended to replace advice given to you by your health care provider. Make sure you discuss any questions you have with your health care provider. Document Revised: 07/08/2017 Document Reviewed: 07/08/2017 Elsevier Patient Education  2021 [place]. Biopsy, Surgery (Curettage) & Surgery (Excision) Aftercare Instructions  1. Okay to remove bandage in 24 hours  2. Wash area with soap and water  3. Apply Vaseline to area twice daily until healed (Not Neosporin)  4. Okay to cover with a Band-Aid to decrease the chance of infection or prevent irritation from clothing; also it's okay to uncover lesion at home.  5. Suture instructions: return to our office in 7-10 or 10-14 days for a nurse visit for suture removal. Variable healing with sutures, if pain or itching occurs call our office. It's okay to shower or bathe 24 hours after sutures are given.  6. The following risks may occur after a biopsy, curettage or excision: bleeding, scarring, discoloration, recurrence,  infection (redness, yellow drainage, pain or swelling).  7. For questions, concerns and results call our office at [place] before 4pm & Friday before 3pm. Biopsy results will be available in 1 week.

## 2020-06-06 ENCOUNTER — Other Ambulatory Visit: Payer: Self-pay | Admitting: Cardiology

## 2020-06-11 ENCOUNTER — Ambulatory Visit: Payer: Managed Care, Other (non HMO) | Admitting: Physician Assistant

## 2020-06-11 NOTE — Addendum Note (Signed)
Addended by: Robyne Askew R on: 06/11/2020 04:29 PM   Modules accepted: Level of Service

## 2020-06-20 ENCOUNTER — Telehealth: Payer: Self-pay

## 2020-06-20 NOTE — Telephone Encounter (Signed)
Phone call to patient with his pathology results.  Path to patient. 

## 2020-06-20 NOTE — Telephone Encounter (Signed)
-----   Message from Warren Danes, [place] sent at 06/18/2020  8:37 AM EDT ----- RTC if recurs

## 2021-05-02 ENCOUNTER — Telehealth: Payer: Self-pay | Admitting: Cardiology

## 2021-05-02 NOTE — Telephone Encounter (Signed)
Patient has moved to [place] and has established care with another office closer to him.

## 2021-06-05 ENCOUNTER — Ambulatory Visit: Payer: Managed Care, Other (non HMO) | Admitting: Physician Assistant
